# Patient Record
Sex: Female | Born: 1990 | Race: Black or African American | Hispanic: No | Marital: Single | Smoking: Former smoker
Health system: Southern US, Community
[De-identification: ages and names within clinical notes are randomized; demographics above are authoritative.]

## PROBLEM LIST (undated history)

## (undated) DIAGNOSIS — Z789 Other specified health status: Secondary | ICD-10-CM

## (undated) HISTORY — PX: NO PAST SURGERIES: SHX2092

---

## 2011-07-10 LAB — OB RESULTS CONSOLE GBS: GBS: NEGATIVE

## 2011-08-20 LAB — OB RESULTS CONSOLE HEPATITIS B SURFACE ANTIGEN: Hepatitis B Surface Ag: NEGATIVE

## 2011-12-27 ENCOUNTER — Encounter (HOSPITAL_COMMUNITY): Payer: Self-pay

## 2011-12-27 ENCOUNTER — Emergency Department (HOSPITAL_COMMUNITY)
Admission: EM | Admit: 2011-12-27 | Discharge: 2011-12-27 | Disposition: A | Discharge status: Home / Self Care | Payer: Medicaid Other | Attending: Emergency Medicine | Admitting: Emergency Medicine

## 2011-12-27 DIAGNOSIS — Z87891 Personal history of nicotine dependence: Secondary | ICD-10-CM | POA: Insufficient documentation

## 2011-12-27 DIAGNOSIS — O210 Mild hyperemesis gravidarum: Secondary | ICD-10-CM | POA: Insufficient documentation

## 2011-12-27 LAB — POCT PREGNANCY, URINE: Preg Test, Ur: POSITIVE — AB

## 2011-12-27 LAB — URINALYSIS, ROUTINE W REFLEX MICROSCOPIC
Glucose, UA: NEGATIVE mg/dL
Hgb urine dipstick: NEGATIVE
Specific Gravity, Urine: 1.02 (ref 1.005–1.030)

## 2011-12-27 MED ORDER — ONDANSETRON 4 MG PO TBDP: 4.0000 mg | ORAL_TABLET | Freq: Three times a day (TID) | ORAL | Status: AC | PRN

## 2011-12-27 NOTE — ED Notes (Signed)
Pt presents with 3 week h/o nausea, pt reports last period was 7/5.  Pt reports intermittent suprapubic pain and reports "it feels like my period".  Pt denies any vaginal discharge or dysuria.

## 2011-12-27 NOTE — ED Notes (Signed)
Prescription given with discharge instructions.  

## 2011-12-27 NOTE — ED Provider Notes (Signed)
History     CSN: [id number]  Arrival date & time 12/27/11  1322   First MD Initiated Contact with Patient 12/27/11 1816      Chief Complaint  Patient presents with  . Morning Sickness    ) HPI Pt presents with 3 week h/o nausea, pt reports last period was 7/5. Pt reports intermittent suprapubic pain and reports "it feels like my period". Pt denies any vaginal discharge or dysuria.  History reviewed. No pertinent past medical history.  History reviewed. No pertinent past surgical history.  No family history on file.  History  Substance Use Topics  . Smoking status: Former Games developer  . Smokeless tobacco: Not on file  . Alcohol Use: No    OB History    Grav Para Term Preterm Abortions TAB SAB Ect Mult Living                  Review of Systems  All other systems reviewed and are negative.    Allergies  Review of patient's allergies indicates no known allergies.  Home Medications   Current Outpatient Rx  Name Route Sig Dispense Refill  . ONDANSETRON 4 MG PO TBDP Oral Take 1 tablet (4 mg total) by mouth every 8 (eight) hours as needed for nausea. 20 tablet 0    BP 132/65  Pulse 70  Temp 98.8 F (37.1 C) (Oral)  Resp 18  Ht 5\' 4"  (1.626 m)  Wt 230 lb (104.327 kg)  BMI 39.48 kg/m2  SpO2 100%  LMP 11/01/2011  Physical Exam  Nursing note and vitals reviewed. Constitutional: She is oriented to person, place, and time. She appears well-developed. No distress.  HENT:  Head: Normocephalic and atraumatic.  Mouth/Throat: Oropharynx is clear and moist and mucous membranes are normal. Mucous membranes are not dry.  Eyes: Pupils are equal, round, and reactive to light.  Neck: Normal range of motion.  Cardiovascular: Normal rate and intact distal pulses.   Pulmonary/Chest: No respiratory distress.  Abdominal: Normal appearance. She exhibits no distension.  Musculoskeletal: Normal range of motion.  Neurological: She is alert and oriented to person, place, and time.  No cranial nerve deficit.  Skin: Skin is warm and dry. No rash noted.  Psychiatric: She has a normal mood and affect. Her behavior is normal.    ED Course  Procedures (including critical care time)  Labs Reviewed  POCT PREGNANCY, URINE - Abnormal; Notable for the following:    Preg Test, Ur POSITIVE (*)     All other components within normal limits  URINALYSIS, ROUTINE W REFLEX MICROSCOPIC   No results found.   1. Morning sickness       MDM          Nelia Shi, MD 12/27/11 [phone]

## 2011-12-27 NOTE — ED Notes (Signed)
Pt c/o the wait.  Explanation for people being called before herself

## 2012-01-20 LAB — OB RESULTS CONSOLE RPR: RPR: NONREACTIVE

## 2012-01-20 LAB — OB RESULTS CONSOLE ABO/RH: RH Type: POSITIVE

## 2012-04-29 NOTE — L&D Delivery Note (Signed)
Delivery Note At 4:22 PM a viable female was delivered via Vaginal, Spontaneous Delivery (Presentation: Left Occiput Anterior).  APGAR: , ; weight .   Placenta status: Intact, Spontaneous.  Cord:  with the following complications: None.  Cord pH: not done  Anesthesia: Epidural  Episiotomy: None Lacerations: None Suture Repair: 2.0 Est. Blood Loss (mL): 250  Mom to postpartum.  Baby to nursery-stable.  [name] A 08/16/2012, 4:35 PM

## 2012-07-09 LAB — OB RESULTS CONSOLE GBS: GBS: NEGATIVE

## 2012-08-16 ENCOUNTER — Inpatient Hospital Stay (HOSPITAL_COMMUNITY)
Admission: AD | Admit: 2012-08-16 | Discharge: 2012-08-18 | DRG: 775 | Disposition: A | Discharge status: Home / Self Care | Payer: Medicaid Other | Source: Ambulatory Visit | Attending: Obstetrics | Admitting: Obstetrics

## 2012-08-16 ENCOUNTER — Encounter (HOSPITAL_COMMUNITY): Payer: Self-pay

## 2012-08-16 ENCOUNTER — Inpatient Hospital Stay (HOSPITAL_COMMUNITY): Payer: Medicaid Other | Admitting: Anesthesiology

## 2012-08-16 ENCOUNTER — Encounter (HOSPITAL_COMMUNITY): Payer: Self-pay | Admitting: Anesthesiology

## 2012-08-16 HISTORY — DX: Other specified health status: Z78.9

## 2012-08-16 LAB — CBC
HCT: 34.9 % — ABNORMAL LOW (ref 36.0–46.0)
Hemoglobin: 12.3 g/dL (ref 12.0–15.0)
RBC: 4.18 MIL/uL (ref 3.87–5.11)
WBC: 10.5 10*3/uL (ref 4.0–10.5)

## 2012-08-16 LAB — TYPE AND SCREEN: ABO/RH(D): A POS

## 2012-08-16 LAB — ABO/RH: ABO/RH(D): A POS

## 2012-08-16 MED ORDER — OXYCODONE-ACETAMINOPHEN 5-325 MG PO TABS: 1.0000 | ORAL_TABLET | ORAL | Status: DC | PRN

## 2012-08-16 MED ORDER — FLEET ENEMA 7-19 GM/118ML RE ENEM: 1.0000 | ENEMA | RECTAL | Status: DC | PRN

## 2012-08-16 MED ORDER — TETANUS-DIPHTH-ACELL PERTUSSIS 5-2.5-18.5 LF-MCG/0.5 IM SUSP
0.5000 mL | Freq: Once | INTRAMUSCULAR | Status: AC
  Administered 2012-08-17: 0.5 mL via INTRAMUSCULAR

## 2012-08-16 MED ORDER — ONDANSETRON HCL 4 MG/2ML IJ SOLN: 4.0000 mg | INTRAMUSCULAR | Status: DC | PRN

## 2012-08-16 MED ORDER — LACTATED RINGERS IV SOLN
500.0000 mL | Freq: Once | INTRAVENOUS | Status: AC
  Administered 2012-08-16: 500 mL via INTRAVENOUS

## 2012-08-16 MED ORDER — ACETAMINOPHEN 325 MG PO TABS: 650.0000 mg | ORAL_TABLET | ORAL | Status: DC | PRN

## 2012-08-16 MED ORDER — WITCH HAZEL-GLYCERIN EX PADS: 1.0000 "application " | MEDICATED_PAD | CUTANEOUS | Status: DC | PRN

## 2012-08-16 MED ORDER — FENTANYL 2.5 MCG/ML BUPIVACAINE 1/10 % EPIDURAL INFUSION (WH - ANES)
14.0000 mL/h | INTRAMUSCULAR | Status: DC | PRN
  Filled 2012-08-16: qty 125

## 2012-08-16 MED ORDER — PRENATAL MULTIVITAMIN CH
1.0000 | ORAL_TABLET | Freq: Every day | ORAL | Status: DC
  Administered 2012-08-17: 1 via ORAL
  Filled 2012-08-16: qty 1

## 2012-08-16 MED ORDER — IBUPROFEN 600 MG PO TABS
600.0000 mg | ORAL_TABLET | Freq: Four times a day (QID) | ORAL | Status: DC
  Administered 2012-08-16 – 2012-08-18 (×7): 600 mg via ORAL
  Filled 2012-08-16 (×3): qty 1

## 2012-08-16 MED ORDER — LIDOCAINE HCL (PF) 1 % IJ SOLN
30.0000 mL | INTRAMUSCULAR | Status: DC | PRN
  Filled 2012-08-16 (×2): qty 30

## 2012-08-16 MED ORDER — BUTORPHANOL TARTRATE 1 MG/ML IJ SOLN
INTRAMUSCULAR | Status: AC
  Administered 2012-08-16: 1 mg via INTRAVENOUS
  Filled 2012-08-16: qty 1

## 2012-08-16 MED ORDER — LACTATED RINGERS IV SOLN: 500.0000 mL | INTRAVENOUS | Status: DC | PRN

## 2012-08-16 MED ORDER — DIPHENHYDRAMINE HCL 50 MG/ML IJ SOLN: 12.5000 mg | INTRAMUSCULAR | Status: DC | PRN

## 2012-08-16 MED ORDER — BENZOCAINE-MENTHOL 20-0.5 % EX AERO: 1.0000 "application " | INHALATION_SPRAY | CUTANEOUS | Status: DC | PRN

## 2012-08-16 MED ORDER — LACTATED RINGERS IV SOLN
INTRAVENOUS | Status: DC
  Administered 2012-08-16: 07:00:00 via INTRAVENOUS

## 2012-08-16 MED ORDER — ONDANSETRON HCL 4 MG/2ML IJ SOLN: 4.0000 mg | Freq: Four times a day (QID) | INTRAMUSCULAR | Status: DC | PRN

## 2012-08-16 MED ORDER — FERROUS SULFATE 325 (65 FE) MG PO TABS
325.0000 mg | ORAL_TABLET | Freq: Two times a day (BID) | ORAL | Status: DC
  Administered 2012-08-17 – 2012-08-18 (×3): 325 mg via ORAL
  Filled 2012-08-16 (×4): qty 1

## 2012-08-16 MED ORDER — EPHEDRINE 5 MG/ML INJ
10.0000 mg | INTRAVENOUS | Status: DC | PRN
  Filled 2012-08-16: qty 4
  Filled 2012-08-16: qty 2

## 2012-08-16 MED ORDER — DIBUCAINE 1 % RE OINT: 1.0000 "application " | TOPICAL_OINTMENT | RECTAL | Status: DC | PRN

## 2012-08-16 MED ORDER — DIPHENHYDRAMINE HCL 25 MG PO CAPS: 25.0000 mg | ORAL_CAPSULE | Freq: Four times a day (QID) | ORAL | Status: DC | PRN

## 2012-08-16 MED ORDER — BUTORPHANOL TARTRATE 1 MG/ML IJ SOLN
1.0000 mg | INTRAMUSCULAR | Status: DC | PRN
  Administered 2012-08-16: 1 mg via INTRAVENOUS

## 2012-08-16 MED ORDER — CITRIC ACID-SODIUM CITRATE 334-500 MG/5ML PO SOLN: 30.0000 mL | ORAL | Status: DC | PRN

## 2012-08-16 MED ORDER — OXYTOCIN BOLUS FROM INFUSION: 500.0000 mL | INTRAVENOUS | Status: DC

## 2012-08-16 MED ORDER — ZOLPIDEM TARTRATE 5 MG PO TABS: 5.0000 mg | ORAL_TABLET | Freq: Every evening | ORAL | Status: DC | PRN

## 2012-08-16 MED ORDER — LIDOCAINE HCL (PF) 1 % IJ SOLN
INTRAMUSCULAR | Status: DC | PRN
  Administered 2012-08-16 (×2): 5 mL
  Administered 2012-08-16: 3 mL

## 2012-08-16 MED ORDER — IBUPROFEN 600 MG PO TABS
600.0000 mg | ORAL_TABLET | Freq: Four times a day (QID) | ORAL | Status: DC | PRN
  Filled 2012-08-16 (×4): qty 1

## 2012-08-16 MED ORDER — EPHEDRINE 5 MG/ML INJ
10.0000 mg | INTRAVENOUS | Status: DC | PRN
  Filled 2012-08-16: qty 2

## 2012-08-16 MED ORDER — PHENYLEPHRINE 40 MCG/ML (10ML) SYRINGE FOR IV PUSH (FOR BLOOD PRESSURE SUPPORT)
80.0000 ug | PREFILLED_SYRINGE | INTRAVENOUS | Status: DC | PRN
  Filled 2012-08-16: qty 2
  Filled 2012-08-16: qty 5

## 2012-08-16 MED ORDER — FENTANYL 2.5 MCG/ML BUPIVACAINE 1/10 % EPIDURAL INFUSION (WH - ANES)
INTRAMUSCULAR | Status: DC | PRN
  Administered 2012-08-16: 14 mL/h via EPIDURAL

## 2012-08-16 MED ORDER — PHENYLEPHRINE 40 MCG/ML (10ML) SYRINGE FOR IV PUSH (FOR BLOOD PRESSURE SUPPORT)
80.0000 ug | PREFILLED_SYRINGE | INTRAVENOUS | Status: DC | PRN
  Administered 2012-08-16: 09:00:00 via INTRAVENOUS
  Filled 2012-08-16: qty 2

## 2012-08-16 MED ORDER — ONDANSETRON HCL 4 MG PO TABS: 4.0000 mg | ORAL_TABLET | ORAL | Status: DC | PRN

## 2012-08-16 MED ORDER — LANOLIN HYDROUS EX OINT: TOPICAL_OINTMENT | CUTANEOUS | Status: DC | PRN

## 2012-08-16 MED ORDER — SIMETHICONE 80 MG PO CHEW: 80.0000 mg | CHEWABLE_TABLET | ORAL | Status: DC | PRN

## 2012-08-16 MED ORDER — SENNOSIDES-DOCUSATE SODIUM 8.6-50 MG PO TABS: 2.0000 | ORAL_TABLET | Freq: Every day | ORAL | Status: DC

## 2012-08-16 MED ORDER — OXYTOCIN 40 UNITS IN LACTATED RINGERS INFUSION - SIMPLE MED
62.5000 mL/h | INTRAVENOUS | Status: DC
  Administered 2012-08-16: 62.5 mL/h via INTRAVENOUS
  Filled 2012-08-16: qty 1000

## 2012-08-16 NOTE — Anesthesia Procedure Notes (Signed)
Epidural Patient location during procedure: OB  Staffing Anesthesiologist: Phillips Grout Performed by: anesthesiologist   Preanesthetic Checklist Completed: patient identified, site marked, surgical consent, pre-op evaluation, timeout performed, IV checked, risks and benefits discussed and monitors and equipment checked  Epidural Patient position: sitting Prep: ChloraPrep Patient monitoring: heart rate, continuous pulse ox and blood pressure Approach: right paramedian Injection technique: LOR saline  Needle:  Needle type: Tuohy  Needle gauge: 17 G Needle length: 9 cm and 9 Needle insertion depth: 10 cm Catheter type: closed end flexible Catheter size: 20 Guage Catheter at skin depth: 15 cm Test dose: negative  Assessment Events: blood not aspirated, injection not painful, no injection resistance, negative IV test and no paresthesia  Additional Notes   Patient tolerated the insertion well without complications.

## 2012-08-16 NOTE — Anesthesia Preprocedure Evaluation (Signed)
Anesthesia Evaluation  Patient identified by MRN, date of birth, ID band Patient awake    Reviewed: Allergy & Precautions, H&P , NPO status , Patient's Chart, lab work & pertinent test results, reviewed documented beta blocker date and time   History of Anesthesia Complications Negative for: history of anesthetic complications  Airway Mallampati: II TM Distance: >3 FB Neck ROM: full    Dental no notable dental hx. (+) Teeth Intact   Pulmonary neg pulmonary ROS,  breath sounds clear to auscultation  Pulmonary exam normal       Cardiovascular Exercise Tolerance: Good negative cardio ROS  Rhythm:regular Rate:Normal     Neuro/Psych negative neurological ROS  negative psych ROS   GI/Hepatic negative GI ROS, Neg liver ROS,   Endo/Other  negative endocrine ROSMorbid obesity  Renal/GU negative Renal ROS  negative genitourinary   Musculoskeletal   Abdominal Normal abdominal exam  (+)   Peds  Hematology negative hematology ROS (+)   Anesthesia Other Findings   Reproductive/Obstetrics negative OB ROS (+) Pregnancy                           Anesthesia Physical Anesthesia Plan  ASA: II  Anesthesia Plan: General   Post-op Pain Management:    Induction:   Airway Management Planned:   Additional Equipment:   Intra-op Plan:   Post-operative Plan:   Informed Consent: I have reviewed the patients History and Physical, chart, labs and discussed the procedure including the risks, benefits and alternatives for the proposed anesthesia with the patient or authorized representative who has indicated his/her understanding and acceptance.   Dental Advisory Given  Plan Discussed with: CRNA  Anesthesia Plan Comments:         Anesthesia Quick Evaluation

## 2012-08-16 NOTE — H&P (Signed)
This is Dr. Francoise Ceo dictating the history and physical on Caitlin Hill she is a 22 year old primigravida EDC 417 1440 weeks and 3 days negative GBS admitted in labor cervix 6 cm 100% vertex -1 amniotomy performed the fluids meconium-stained she is an epidural Past medical history negative Past surgical history negative Social history negative System review negative Physical exam well-developed female in labor HEENT negative Heart regular rhythm no murmurs no gallops Breasts negative Lung lungs clear to P&A Abdomen term Pelvic as described above Extremities negative

## 2012-08-16 NOTE — MAU Note (Signed)
Pt came in via EMS stating contractions began at 4:50am and they are "coming back to back". Denies vaginal bleeding but does not know if water broke.

## 2012-08-17 LAB — CBC
MCH: 28.7 pg (ref 26.0–34.0)
MCHC: 33.7 g/dL (ref 30.0–36.0)
Platelets: 249 10*3/uL (ref 150–400)
RBC: 3.48 MIL/uL — ABNORMAL LOW (ref 3.87–5.11)

## 2012-08-17 NOTE — Progress Notes (Signed)
Patient ID: Caitlin Hill, female   DOB: [birth year], 22 y.o.   MRN: [id number] Postpartum day one Vital signs normal Fundus firm Lochia moderate Legs negative doing well

## 2012-08-17 NOTE — Progress Notes (Signed)
UR chart review completed.  

## 2012-08-17 NOTE — Anesthesia Postprocedure Evaluation (Signed)
  Anesthesia Post-op Note  Patient: Caitlin Hill  Procedure(s) Performed: * No procedures listed *  Patient Location: Mother/Baby  Anesthesia Type:Epidural  Level of Consciousness: awake, alert  and oriented  Airway and Oxygen Therapy: Patient Spontanous Breathing  Post-op Pain: mild  Post-op Assessment: Patient's Cardiovascular Status Stable and Respiratory Function Stable  Post-op Vital Signs: stable  Complications: No apparent anesthesia complications

## 2012-08-18 NOTE — Discharge Summary (Signed)
Obstetric Discharge Summary Reason for Admission: onset of labor Prenatal Procedures: none Intrapartum Procedures: spontaneous vaginal delivery Postpartum Procedures: none Complications-Operative and Postpartum: none Hemoglobin  Date Value Range Status  08/17/2012 10.0* 12.0 - 15.0 g/dL Final     DELTA CHECK NOTED     REPEATED TO VERIFY     HCT  Date Value Range Status  08/17/2012 29.7* 36.0 - 46.0 % Final    Physical Exam:  General: alert Lochia: appropriate Uterine Fundus: firm Incision: healing well DVT Evaluation: No evidence of DVT seen on physical exam.  Discharge Diagnoses: Term Pregnancy-delivered  Discharge Information: Date: 08/18/2012 Activity: pelvic rest Diet: routine Medications: Percocet Condition: stable Instructions: refer to practice specific booklet Discharge to: home Follow-up Information   Follow up with Caitlin Hill A, MD. Schedule an appointment as soon as possible for a visit in 6 weeks.   Contact information:   [address] [place] [address] [phone]       Newborn Data: Live born female  Birth Weight: 5 lb 15.9 oz (2719 g) APGAR: 7, 8  Home with mother.  Caitlin Hill A 08/18/2012, 6:38 AM

## 2014-02-28 ENCOUNTER — Encounter (HOSPITAL_COMMUNITY): Payer: Self-pay

## 2017-05-29 ENCOUNTER — Other Ambulatory Visit: Payer: Self-pay

## 2017-05-29 ENCOUNTER — Emergency Department (HOSPITAL_COMMUNITY)
Admission: EM | Admit: 2017-05-29 | Discharge: 2017-05-29 | Disposition: A | Discharge status: Home / Self Care | Payer: Medicaid Other | Attending: Emergency Medicine | Admitting: Emergency Medicine

## 2017-05-29 ENCOUNTER — Encounter (HOSPITAL_COMMUNITY): Payer: Self-pay

## 2017-05-29 DIAGNOSIS — Z87891 Personal history of nicotine dependence: Secondary | ICD-10-CM | POA: Diagnosis not present

## 2017-05-29 DIAGNOSIS — H7291 Unspecified perforation of tympanic membrane, right ear: Secondary | ICD-10-CM | POA: Diagnosis not present

## 2017-05-29 DIAGNOSIS — H9201 Otalgia, right ear: Secondary | ICD-10-CM | POA: Diagnosis present

## 2017-05-29 LAB — I-STAT BETA HCG BLOOD, ED (MC, WL, AP ONLY): I-stat hCG, quantitative: <5 m[IU]/mL (ref <5)

## 2017-05-29 MED ORDER — OFLOXACIN 0.3 % OP SOLN
5.0000 [drp] | Freq: Two times a day (BID) | OPHTHALMIC | Status: DC
  Administered 2017-05-29: 5 [drp] via OTIC
  Filled 2017-05-29: qty 5

## 2017-05-29 MED ORDER — NAPROXEN 375 MG PO TABS: 375.0000 mg | ORAL_TABLET | Freq: Two times a day (BID) | ORAL | 0 refills | Status: DC

## 2017-05-29 MED ORDER — IBUPROFEN 400 MG PO TABS
600.0000 mg | ORAL_TABLET | Freq: Once | ORAL | Status: AC
  Administered 2017-05-29: 600 mg via ORAL
  Filled 2017-05-29: qty 1

## 2017-05-29 NOTE — ED Triage Notes (Signed)
Pt reports standing and having a pop in her ear last Friday and has had pain in her right ear since.

## 2017-05-29 NOTE — ED Notes (Signed)
Declined W/C at D/C and was escorted to lobby by RN. 

## 2017-05-29 NOTE — ED Triage Notes (Signed)
Pt now reports ear feels swollen

## 2017-05-29 NOTE — Discharge Instructions (Signed)
Instill 5 drops of the oflaxacin into the right ear 2 times daily for 5 days. Please follow-up with your primary care provider within the next two weeks.

## 2017-05-29 NOTE — ED Provider Notes (Signed)
[facility] Provider Note   CSN: [id number] Arrival date & time: 05/29/17  1508     History   Chief Complaint Chief Complaint  Patient presents with  . Otalgia    HPI Terence LuxLashaunda Klemmer is a 27 y.o. female.  Patient states she stood up at work on Friday and felt a pop in her ear. She reports pain to the ear and blood tinged drainage.   The history is provided by the patient. No language interpreter was used.  Otalgia  The current episode started more than 2 days ago. There is pain in the right ear. The problem occurs constantly. The problem has not changed since onset.There has been no fever. The pain is moderate. Associated symptoms include ear discharge.    Past Medical History:  Diagnosis Date  . Medical history non-contributory     There are no active problems to display for this patient.   Past Surgical History:  Procedure Laterality Date  . NO PAST SURGERIES      OB History    Gravida Para Term Preterm AB Living   1 1 1     1    SAB TAB Ectopic Multiple Live Births           1       Home Medications    Prior to Admission medications   Not on File    Family History No family history on file.  Social History Social History   Tobacco Use  . Smoking status: Former Smoker  Substance Use Topics  . Alcohol use: No  . Drug use: No     Allergies   Patient has no known allergies.   Review of Systems Review of Systems  HENT: Positive for ear discharge and ear pain.   All other systems reviewed and are negative.    Physical Exam Updated Vital Signs BP 137/81   Pulse 82   Temp 98.4 F (36.9 C)   Resp 18   Wt 120.2 kg (265 lb)   SpO2 100%   BMI 46.94 kg/m   Physical Exam  Constitutional: She is oriented to person, place, and time. She appears well-developed and well-nourished.  HENT:  Right Ear: Tympanic membrane is perforated.  Mouth/Throat: Oropharynx is clear and moist.  Eyes: Conjunctivae  are normal.  Neck: Neck supple.  Cardiovascular: Normal rate and regular rhythm.  Pulmonary/Chest: Effort normal and breath sounds normal.  Abdominal: Soft. Bowel sounds are normal.  Musculoskeletal: Normal range of motion.  Lymphadenopathy:    She has no cervical adenopathy.  Neurological: She is alert and oriented to person, place, and time.  Skin: Skin is warm and dry.  Psychiatric: She has a normal mood and affect.  Nursing note and vitals reviewed.    ED Treatments / Results  Labs (all labs ordered are listed, but only abnormal results are displayed) Labs Reviewed - No data to display  EKG  EKG Interpretation None       Radiology No results found.  Procedures Procedures (including critical care time)  Medications Ordered in ED Medications  ofloxacin (OCUFLOX) 0.3 % ophthalmic solution 5 drop (5 drops Right EAR Given 05/29/17 1829)  ibuprofen (ADVIL,MOTRIN) tablet 600 mg (600 mg Oral Given 05/29/17 1811)     Initial Impression / Assessment and Plan / ED Course  I have reviewed the triage vital signs and the nursing notes.  Pertinent labs & imaging results that were available during my care of the patient were reviewed  by me and considered in my medical decision making (see chart for details).     Patient with non-traumatic, spontaneous tympanic membrane rupture of the right ear. No vestibular symptoms. No facial nerve paralysis. No foreign body. Will treat with ofloxacin. Follow-up with PCP. ENT referral information also provided. Care instructions and return precautions discussed. Patient appears safe for discharge at this time.  Final Clinical Impressions(s) / ED Diagnoses   Final diagnoses:  Tympanic membrane perforation, right    ED Discharge Orders        Ordered    naproxen (NAPROSYN) 375 MG tablet  2 times daily     05/29/17 1835       Felicie Morn, NP 05/29/17 1845    Tegeler, Canary Brim, MD 05/30/17 [phone]

## 2017-09-01 ENCOUNTER — Encounter (HOSPITAL_COMMUNITY): Payer: Self-pay | Admitting: *Deleted

## 2017-09-01 ENCOUNTER — Other Ambulatory Visit: Payer: Self-pay

## 2017-09-01 ENCOUNTER — Emergency Department (HOSPITAL_COMMUNITY)
Admission: EM | Admit: 2017-09-01 | Discharge: 2017-09-01 | Disposition: A | Discharge status: Home / Self Care | Payer: Medicaid Other | Attending: Emergency Medicine | Admitting: Emergency Medicine

## 2017-09-01 DIAGNOSIS — B9689 Other specified bacterial agents as the cause of diseases classified elsewhere: Secondary | ICD-10-CM | POA: Diagnosis not present

## 2017-09-01 DIAGNOSIS — Z79899 Other long term (current) drug therapy: Secondary | ICD-10-CM | POA: Diagnosis not present

## 2017-09-01 DIAGNOSIS — N760 Acute vaginitis: Secondary | ICD-10-CM | POA: Diagnosis not present

## 2017-09-01 DIAGNOSIS — Z87891 Personal history of nicotine dependence: Secondary | ICD-10-CM | POA: Insufficient documentation

## 2017-09-01 DIAGNOSIS — N939 Abnormal uterine and vaginal bleeding, unspecified: Secondary | ICD-10-CM | POA: Insufficient documentation

## 2017-09-01 LAB — CBC WITH DIFFERENTIAL/PLATELET
Basophils Absolute: 0 10*3/uL (ref 0.0–0.1)
Basophils Relative: 0 %
Eosinophils Absolute: 0.3 10*3/uL (ref 0.0–0.7)
Eosinophils Relative: 4 %
HCT: 35.1 % — ABNORMAL LOW (ref 36.0–46.0)
HEMOGLOBIN: 10.9 g/dL — AB (ref 12.0–15.0)
LYMPHS ABS: 2.2 10*3/uL (ref 0.7–4.0)
LYMPHS PCT: 33 %
MCH: 23.3 pg — AB (ref 26.0–34.0)
MCHC: 31.1 g/dL (ref 30.0–36.0)
MCV: 75.2 fL — AB (ref 78.0–100.0)
Monocytes Absolute: 0.4 10*3/uL (ref 0.1–1.0)
Monocytes Relative: 6 %
NEUTROS ABS: 3.9 10*3/uL (ref 1.7–7.7)
NEUTROS PCT: 57 %
PLATELETS: 397 10*3/uL (ref 150–400)
RBC: 4.67 MIL/uL (ref 3.87–5.11)
RDW: 16.6 % — AB (ref 11.5–15.5)
WBC: 6.8 10*3/uL (ref 4.0–10.5)

## 2017-09-01 LAB — WET PREP, GENITAL
Sperm: NONE SEEN
Trich, Wet Prep: NONE SEEN
Yeast Wet Prep HPF POC: NONE SEEN

## 2017-09-01 LAB — I-STAT BETA HCG BLOOD, ED (MC, WL, AP ONLY): I-stat hCG, quantitative: <5 m[IU]/mL (ref <5)

## 2017-09-01 LAB — I-STAT CG4 LACTIC ACID, ED: Lactic Acid, Venous: 1.66 mmol/L (ref 0.5–1.9)

## 2017-09-01 MED ORDER — METRONIDAZOLE 500 MG PO TABS: 500.0000 mg | ORAL_TABLET | Freq: Two times a day (BID) | ORAL | 0 refills | Status: DC

## 2017-09-01 MED ORDER — MEDROXYPROGESTERONE ACETATE 10 MG PO TABS: 10.0000 mg | ORAL_TABLET | Freq: Three times a day (TID) | ORAL | 0 refills | Status: DC

## 2017-09-01 NOTE — ED Provider Notes (Addendum)
[facility] EMERGENCY DEPARTMENT Provider Note   CSN: [id number] Arrival date & time: 09/01/17  1228   History   Chief Complaint Chief Complaint  Patient presents with  . Vaginal Bleeding    HPI Caitlin Hill is a 27 y.o. female.  HPI   27 year old female presents today with complaints of vaginal bleeding.  Patient notes that she has irregular menstrual cycles, most notably in February she started having off and on bleeding.  She notes yesterday was heavier, today spotting.  Patient notes in the past she has taken birth control which helped stop her menstrual cycle.  Patient denies any associated abdominal pain, dizziness, history of bleeding disorders.  Patient denies vaginal discharge.  Patient is a non-smoker with no history of DVTs.    Past Medical History:  Diagnosis Date  . Medical history non-contributory     There are no active problems to display for this patient.   Past Surgical History:  Procedure Laterality Date  . NO PAST SURGERIES       OB History    Gravida  1   Para  1   Term  1   Preterm      AB      Living  1     SAB      TAB      Ectopic      Multiple      Live Births  1            Home Medications    Prior to Admission medications   Medication Sig Start Date End Date Taking? Authorizing Provider  medroxyPROGESTERone (PROVERA) 10 MG tablet Take 1 tablet (10 mg total) by mouth 3 (three) times daily. 09/01/17   Lux Meaders, Tinnie Gens, PA-C  metroNIDAZOLE (FLAGYL) 500 MG tablet Take 1 tablet (500 mg total) by mouth 2 (two) times daily. 09/01/17   [place] Takacs, Tinnie Gens, PA-C  naproxen (NAPROSYN) 375 MG tablet Take 1 tablet (375 mg total) by mouth 2 (two) times daily. 05/29/17   Felicie Morn, NP    Family History No family history on file.  Social History Social History   Tobacco Use  . Smoking status: Former Smoker    Last attempt to quit: 03/29/2017    Years since quitting: 0.4  . Smokeless tobacco: Never Used    Substance Use Topics  . Alcohol use: No  . Drug use: No     Allergies   Patient has no known allergies.   Review of Systems Review of Systems  All other systems reviewed and are negative.    Physical Exam Updated Vital Signs BP (!) 141/79 (BP Location: Right Arm)   Pulse 88   Temp 98.3 F (36.8 C) (Oral)   Resp 16   Ht  (1.626 m)   Wt 120.2 kg (265 lb)   LMP 09/01/2017   SpO2 98%   Breastfeeding? No   BMI 45.49 kg/m   Physical Exam  Constitutional: She is oriented to person, place, and time. She appears well-developed and well-nourished.  HENT:  Head: Normocephalic and atraumatic.  Eyes: Pupils are equal, round, and reactive to light. Conjunctivae are normal. Right eye exhibits no discharge. Left eye exhibits no discharge. No scleral icterus.  Neck: Normal range of motion. No JVD present. No tracheal deviation present.  Pulmonary/Chest: Effort normal. No stridor.  Abdominal: Soft. She exhibits no distension. There is no tenderness.  Genitourinary:  Genitourinary Comments: Blood noted in the vaginal vault, no significant discharge, no friability  lacerations-no cervical motion tenderness  Neurological: She is alert and oriented to person, place, and time. Coordination normal.  Psychiatric: She has a normal mood and affect. Her behavior is normal. Judgment and thought content normal.  Nursing note and vitals reviewed.    ED Treatments / Results  Labs (all labs ordered are listed, but only abnormal results are displayed) Labs Reviewed  WET PREP, GENITAL - Abnormal; Notable for the following components:      Result Value   Clue Cells Wet Prep HPF POC PRESENT (*)    WBC, Wet Prep HPF POC MODERATE (*)    All other components within normal limits  CBC WITH DIFFERENTIAL/PLATELET - Abnormal; Notable for the following components:   Hemoglobin 10.9 (*)    HCT 35.1 (*)    MCV 75.2 (*)    MCH 23.3 (*)    RDW 16.6 (*)    All other components within normal limits   I-STAT BETA HCG BLOOD, ED ([facility], [facility], [facility] ONLY)  I-STAT BETA HCG BLOOD, ED ([facility], [facility], [facility] ONLY)  I-STAT CG4 LACTIC ACID, ED  GC/CHLAMYDIA PROBE AMP ([place]) NOT AT [facility]    EKG None  Radiology No results found.  Procedures Procedures (including critical care time)  Medications Ordered in ED Medications - No data to display   Initial Impression / Assessment and Plan / ED Course  I have reviewed the triage vital signs and the nursing notes.  Pertinent labs & imaging results that were available during my care of the patient were reviewed by me and considered in my medical decision making (see chart for details).      Labs: Wet prep, i-STAT beta-hCG, i-STAT lactic acid, CBC  Imaging:  Consults:  Therapeutics:  Discharge Meds:   Assessment/Plan:   27 year old female presents today with vaginal bleeding.  Patient's hemoglobin appears stable from her visit in February, no signs of vital sign instability.  Patient requesting birth control, I find this reasonable given her duration of symptoms.  She will be placed on 7-day course of progesterone and encouraged to follow-up closely with OB/GYN.  Patient was counseled on risks and benefits of taking medication she would like to proceed.  She is given strict return precautions, she verbalized understanding and agreement to today's plan had no further questions or concerns at the time discharge  Final Clinical Impressions(s) / ED Diagnoses   Final diagnoses:  Vaginal bleeding  Bacterial vaginosis    ED Discharge Orders        Ordered    medroxyPROGESTERone (PROVERA) 10 MG tablet  3 times daily     09/01/17 1838    metroNIDAZOLE (FLAGYL) 500 MG tablet  2 times daily     09/01/17 1857       Eyvonne Mechanic, PA-C 09/01/17 1926    Eyvonne Mechanic, PA-C 09/01/17 1926    Derwood Kaplan, MD 09/03/17 1846

## 2017-09-01 NOTE — ED Provider Notes (Signed)
Patient placed in Quick Look pathway, seen and evaluated   Chief Complaint: vaginal bleeding HPI:   Pt with bleeding since february. No pain. States bleeding at times heavy with clots, at times just spotting. Hx of the same, just never this severe. Does not have obgyn. States in the past birth control helped, requesting the same. Not sure if pregnant.   ROS: positive for vaginal bleeding. Negative for abd pain, dizziness, lightheadiness  Physical Exam:   Gen: No distress  Neuro: Awake and Alert  Skin: Warm    Focused Exam: abdomen soft,  Non tender. No pallor.    Initiation of care has begun. The patient has been counseled on the process, plan, and necessity for staying for the completion/evaluation, and the remainder of the medical screening examination   Pt with vaginal bleeding for last 3 months. Hx of the same. Will check hgb and preg test.    Jaynie Crumble, PA-C 09/01/17 1327    Mancel Bale, MD 09/02/17 [phone]

## 2017-09-01 NOTE — ED Notes (Signed)
Patient brought back to room and given urine spec cup.

## 2017-09-01 NOTE — ED Notes (Signed)
IM paged to [facility] regarding a unassigned admission

## 2017-09-01 NOTE — ED Triage Notes (Signed)
Pt in c/o constant vaginal bleeding since middle of  Feb 2019, denies pain, pt reports bleeding with wiping and changing pads at times every 2 hours, pt reports hx of taking birth control for vaginal bleeding in 2016 while living in [place], denies weakness, A&Ox 4

## 2017-09-01 NOTE — ED Notes (Signed)
IM repaged

## 2017-09-01 NOTE — Discharge Instructions (Signed)
Please read attached information. If you experience any new or worsening signs or symptoms please return to the emergency room for evaluation. Please follow-up with your primary care provider or specialist as discussed. Please use medication prescribed only as directed and discontinue taking if you have any concerning signs or symptoms.   °

## 2017-09-01 NOTE — ED Notes (Signed)
Patient called x 2 with no response. 

## 2017-09-02 LAB — GC/CHLAMYDIA PROBE AMP (~~LOC~~) NOT AT ARMC
Chlamydia: NEGATIVE
Neisseria Gonorrhea: NEGATIVE

## 2017-09-30 ENCOUNTER — Encounter (HOSPITAL_COMMUNITY): Payer: Self-pay | Admitting: *Deleted

## 2017-09-30 ENCOUNTER — Inpatient Hospital Stay (HOSPITAL_COMMUNITY)
Admission: AD | Admit: 2017-09-30 | Discharge: 2017-09-30 | Disposition: A | Discharge status: Home / Self Care | Payer: Medicaid Other | Source: Ambulatory Visit | Attending: Obstetrics & Gynecology | Admitting: Obstetrics & Gynecology

## 2017-09-30 ENCOUNTER — Other Ambulatory Visit: Payer: Self-pay

## 2017-09-30 DIAGNOSIS — Z79899 Other long term (current) drug therapy: Secondary | ICD-10-CM | POA: Diagnosis not present

## 2017-09-30 DIAGNOSIS — N939 Abnormal uterine and vaginal bleeding, unspecified: Secondary | ICD-10-CM | POA: Diagnosis not present

## 2017-09-30 DIAGNOSIS — Z792 Long term (current) use of antibiotics: Secondary | ICD-10-CM | POA: Insufficient documentation

## 2017-09-30 DIAGNOSIS — Z793 Long term (current) use of hormonal contraceptives: Secondary | ICD-10-CM | POA: Diagnosis not present

## 2017-09-30 DIAGNOSIS — R109 Unspecified abdominal pain: Secondary | ICD-10-CM | POA: Diagnosis present

## 2017-09-30 DIAGNOSIS — D500 Iron deficiency anemia secondary to blood loss (chronic): Secondary | ICD-10-CM

## 2017-09-30 LAB — URINALYSIS, ROUTINE W REFLEX MICROSCOPIC
BACTERIA UA: NONE SEEN
BILIRUBIN URINE: NEGATIVE
Glucose, UA: NEGATIVE mg/dL
Ketones, ur: NEGATIVE mg/dL
LEUKOCYTES UA: NEGATIVE
NITRITE: NEGATIVE
Protein, ur: 30 mg/dL — AB
SPECIFIC GRAVITY, URINE: 1.015 (ref 1.005–1.030)
pH: 5 (ref 5.0–8.0)

## 2017-09-30 LAB — CBC
HEMATOCRIT: 29.8 % — AB (ref 36.0–46.0)
Hemoglobin: 9.6 g/dL — ABNORMAL LOW (ref 12.0–15.0)
MCH: 23.8 pg — ABNORMAL LOW (ref 26.0–34.0)
MCHC: 32.2 g/dL (ref 30.0–36.0)
MCV: 73.8 fL — AB (ref 78.0–100.0)
Platelets: 299 10*3/uL (ref 150–400)
RBC: 4.04 MIL/uL (ref 3.87–5.11)
RDW: 16.3 % — AB (ref 11.5–15.5)
WBC: 4.9 10*3/uL (ref 4.0–10.5)

## 2017-09-30 LAB — POCT PREGNANCY, URINE: Preg Test, Ur: NEGATIVE

## 2017-09-30 MED ORDER — MEGESTROL ACETATE 40 MG PO TABS: 40.0000 mg | ORAL_TABLET | Freq: Two times a day (BID) | ORAL | 0 refills | Status: DC

## 2017-09-30 NOTE — MAU Note (Signed)
So period has been on for like 2 wks, really heavy flow. Having cramps the last 2 days.   Usually period only lasts 3 days and doesn't have cramping.  [facility]

## 2017-09-30 NOTE — Discharge Instructions (Signed)
Abnormal Uterine Bleeding Abnormal uterine bleeding means bleeding more than usual from your uterus. It can include:  Bleeding between periods.  Bleeding after sex.  Bleeding that is heavier than normal.  Periods that last longer than usual.  Bleeding after you have stopped having your period (menopause).  There are many problems that may cause this. You should see a doctor for any kind of bleeding that is not normal. Treatment depends on the cause of the bleeding. Follow these instructions at home:  Watch your condition for any changes.  Do not use tampons, douche, or have sex, if your doctor tells you not to.  Change your pads often.  Get regular well-woman exams. Make sure they include a pelvic exam and cervical cancer screening.  Keep all follow-up visits as told by your doctor. This is important. Contact a doctor if:  The bleeding lasts more than one week.  You feel dizzy at times.  You feel like you are going to throw up (nauseous).  You throw up. Get help right away if:  You pass out.  You have to change pads every hour.  You have belly (abdominal) pain.  You have a fever.  You get sweaty.  You get weak.  You passing large blood clots from your vagina. Summary  Abnormal uterine bleeding means bleeding more than usual from your uterus.  There are many problems that may cause this. You should see a doctor for any kind of bleeding that is not normal.  Treatment depends on the cause of the bleeding. This information is not intended to replace advice given to you by your health care provider. Make sure you discuss any questions you have with your health care provider. Document Released: 02/10/2009 Document Revised: 04/09/2016 Document Reviewed: 04/09/2016 Elsevier Interactive Patient Education  2017 [employer]   Anemia Anemia is a condition in which you do not have enough red blood cells or hemoglobin. Hemoglobin is a substance in red blood cells  that carries oxygen. When you do not have enough red blood cells or hemoglobin (are anemic), your body cannot get enough oxygen and your organs may not work properly. As a result, you may feel very tired or have other problems. What are the causes? Common causes of anemia include:  Excessive bleeding. Anemia can be caused by excessive bleeding inside or outside the body, including bleeding from the intestine or from periods in women.  Poor nutrition.  Long-lasting (chronic) kidney, thyroid, and liver disease.  Bone marrow disorders.  Cancer and treatments for cancer.  HIV (human immunodeficiency virus) and AIDS (acquired immunodeficiency syndrome).  Treatments for HIV and AIDS.  Spleen problems.  Blood disorders.  Infections, medicines, and autoimmune disorders that destroy red blood cells.  What are the signs or symptoms? Symptoms of this condition include:  Minor weakness.  Dizziness.  Headache.  Feeling heartbeats that are irregular or faster than normal (palpitations).  Shortness of breath, especially with exercise.  Paleness.  Cold sensitivity.  Indigestion.  Nausea.  Difficulty sleeping.  Difficulty concentrating.  Symptoms may occur suddenly or develop slowly. If your anemia is mild, you may not have symptoms. How is this diagnosed? This condition is diagnosed based on:  Blood tests.  Your medical history.  A physical exam.  Bone marrow biopsy.  Your health care provider may also check your stool (feces) for blood and may do additional testing to look for the cause of your bleeding. You may also have other tests, including:  Imaging tests, such  as a CT scan or MRI.  Endoscopy.  Colonoscopy.  How is this treated? Treatment for this condition depends on the cause. If you continue to lose a lot of blood, you may need to be treated at a hospital. Treatment may include:  Taking supplements of iron, vitamin Q72, or folic acid.  Taking a  hormone medicine (erythropoietin) that can help to stimulate red blood cell growth.  Having a blood transfusion. This may be needed if you lose a lot of blood.  Making changes to your diet.  Having surgery to remove your spleen.  Follow these instructions at home:  Take over-the-counter and prescription medicines only as told by your health care provider.  Take supplements only as told by your health care provider.  Follow any diet instructions that you were given.  Keep all follow-up visits as told by your health care provider. This is important. Contact a health care provider if:  You develop new bleeding anywhere in the body. Get help right away if:  You are very weak.  You are short of breath.  You have pain in your abdomen or chest.  You are dizzy or feel faint.  You have trouble concentrating.  You have bloody or black, tarry stools.  You vomit repeatedly or you vomit up blood. Summary  Anemia is a condition in which you do not have enough red blood cells or enough of a substance in your red blood cells that carries oxygen (hemoglobin).  Symptoms may occur suddenly or develop slowly.  If your anemia is mild, you may not have symptoms.  This condition is diagnosed with blood tests as well as a medical history and physical exam. Other tests may be needed.  Treatment for this condition depends on the cause of the anemia. This information is not intended to replace advice given to you by your health care provider. Make sure you discuss any questions you have with your health care provider. Document Released: 05/23/2004 Document Revised: 05/17/2016 Document Reviewed: 05/17/2016 Elsevier Interactive Patient Education  [employer].

## 2017-09-30 NOTE — [facility] Provider Note (Addendum)
History     CSN: [id number]  Arrival date and time: 09/30/17 1324   First Provider Initiated Contact with Patient 09/30/17 1433      Chief Complaint  Patient presents with  . Abdominal Pain  . Vaginal Bleeding   27 y.o. Non-pregnant female here with VB. Bleeding started almost 2 weeks ago. Heavy on some days, saturates a pad over 1 hr or less, passes golf ball sized clots and smaller ones. Associated sx are uterine cramping. Has not taken anything for the pain. Was last sexually active 1 month ago. No new partner. Does not use contraception. Was seen in [facility] last month for AUB lasting 5 mos. Given Provera which stopped bleeding but then it returned. [place] from [place] and does not have GYN provider here. Recent STD screen negative.   Past Medical History:  Diagnosis Date  . Medical history non-contributory     Past Surgical History:  Procedure Laterality Date  . NO PAST SURGERIES      History reviewed. No pertinent family history.  Social History   Tobacco Use  . Smoking status: Former Smoker    Last attempt to quit: 03/29/2017    Years since quitting: 0.5  . Smokeless tobacco: Never Used  Substance Use Topics  . Alcohol use: No  . Drug use: No    Allergies: No Known Allergies  Medications Prior to Admission  Medication Sig Dispense Refill Last Dose  . medroxyPROGESTERone (PROVERA) 10 MG tablet Take 1 tablet (10 mg total) by mouth 3 (three) times daily. 21 tablet 0   . metroNIDAZOLE (FLAGYL) 500 MG tablet Take 1 tablet (500 mg total) by mouth 2 (two) times daily. 14 tablet 0   . naproxen (NAPROSYN) 375 MG tablet Take 1 tablet (375 mg total) by mouth 2 (two) times daily. 20 tablet 0     Review of Systems  Constitutional: Negative.   Gastrointestinal: Positive for abdominal pain.  Genitourinary: Positive for vaginal bleeding.   Physical Exam   Blood pressure (!) 148/74, pulse 89, temperature 98.4 F (36.9 C), temperature source Oral, resp. rate 20, weight  (!) 338 lb 12 oz (153.7 kg), last menstrual period 09/19/2017, SpO2 99 %.  Physical Exam  Constitutional: She is oriented to person, place, and time. She appears well-developed and well-nourished. No distress.  HENT:  Head: Normocephalic and atraumatic.  Neck: Normal range of motion.  Cardiovascular: Normal rate.  Respiratory: Effort normal. No respiratory distress.  Genitourinary:  Genitourinary Comments: External: no lesions or erythema Vagina: rugated, pink, moist, small bloody discharge, cleared with 1 fox swab Uterus/adnexae: unable to assess d/t body habitus No CMT    Musculoskeletal: Normal range of motion.  Neurological: She is alert and oriented to person, place, and time.  Skin: Skin is warm and dry.  Psychiatric: She has a normal mood and affect.   Results for orders placed or performed during the hospital encounter of 09/30/17 (from the past 24 hour(s))  Urinalysis, Routine w reflex microscopic     Status: Abnormal   Collection Time: 09/30/17  1:55 PM  Result Value Ref Range   Color, Urine AMBER (A) YELLOW   APPearance HAZY (A) CLEAR   Specific Gravity, Urine 1.015 1.005 - 1.030   pH 5.0 5.0 - 8.0   Glucose, UA NEGATIVE NEGATIVE mg/dL   Hgb urine dipstick LARGE (A) NEGATIVE   Bilirubin Urine NEGATIVE NEGATIVE   Ketones, ur NEGATIVE NEGATIVE mg/dL   Protein, ur 30 (A) NEGATIVE mg/dL   Nitrite NEGATIVE  NEGATIVE   Leukocytes, UA NEGATIVE NEGATIVE   RBC / HPF >50 (H) 0 - 5 RBC/hpf   WBC, UA 21-50 0 - 5 WBC/hpf   Bacteria, UA NONE SEEN NONE SEEN   Squamous Epithelial / LPF 0-5 0 - 5   Mucus PRESENT   Pregnancy, urine POC     Status: None   Collection Time: 09/30/17  2:16 PM  Result Value Ref Range   Preg Test, Ur NEGATIVE NEGATIVE  CBC     Status: Abnormal   Collection Time: 09/30/17  2:55 PM  Result Value Ref Range   WBC 4.9 4.0 - 10.5 K/uL   RBC 4.04 3.87 - 5.11 MIL/uL   Hemoglobin 9.6 (L) 12.0 - 15.0 g/dL   HCT 40.9 (L) 81.1 - 91.4 %   MCV 73.8 (L) 78.0 -  100.0 fL   MCH 23.8 (L) 26.0 - 34.0 pg   MCHC 32.2 30.0 - 36.0 g/dL   RDW 78.2 (H) 95.6 - 21.3 %   Platelets 299 150 - 400 K/uL   MAU Course  Procedures  MDM Labs ordered and reviewed. Mild anemia noted, pt recently started MTV with Fe, instructed to continue, increase dietary sources. No evidence of acute pelvic process. Discussed mngt with Megace and further outp work-up including TVUS, pt agrees. Pt aware Megace is not contraception and should use barrier method if doesn't desire pregnancy. Stable for discharge home.  Assessment and Plan   1. Abnormal uterine bleeding (AUB)   2. Blood loss anemia    Discharge home Follow up in [facility] after US Pelvic US in 1-2 weeks Rx Megace  Allergies as of 09/30/2017   No Known Allergies     Medication List    STOP taking these medications   medroxyPROGESTERone 10 MG tablet Commonly known as:  PROVERA     TAKE these medications   megestrol 40 MG tablet Commonly known as:  MEGACE Take 1 tablet (40 mg total) by mouth 2 (two) times daily.   metroNIDAZOLE 500 MG tablet Commonly known as:  FLAGYL Take 1 tablet (500 mg total) by mouth 2 (two) times daily.   naproxen 375 MG tablet Commonly known as:  NAPROSYN Take 1 tablet (375 mg total) by mouth 2 (two) times daily.      Donette Larry, CNM 09/30/2017, 4:18 PM

## 2017-10-07 ENCOUNTER — Telehealth: Payer: Self-pay | Admitting: General Practice

## 2017-10-07 NOTE — Telephone Encounter (Signed)
Patient notified of appointment with Dr. Marice Potterove on 10/22/17 at 3:15pm for f/u from US on 10/15/17.  Patient verbalized understanding.

## 2017-10-15 ENCOUNTER — Ambulatory Visit (HOSPITAL_COMMUNITY): Payer: Medicaid Other

## 2017-10-21 ENCOUNTER — Other Ambulatory Visit (HOSPITAL_COMMUNITY): Payer: Medicaid Other

## 2017-10-21 ENCOUNTER — Ambulatory Visit (HOSPITAL_COMMUNITY)
Admission: RE | Admit: 2017-10-21 | Discharge: 2017-10-21 | Disposition: A | Discharge status: Home / Self Care | Payer: Medicaid Other | Source: Ambulatory Visit | Attending: Certified Nurse Midwife | Admitting: Certified Nurse Midwife

## 2017-10-21 DIAGNOSIS — N939 Abnormal uterine and vaginal bleeding, unspecified: Secondary | ICD-10-CM | POA: Insufficient documentation

## 2017-10-22 ENCOUNTER — Encounter: Payer: Self-pay | Admitting: Obstetrics & Gynecology

## 2017-10-22 ENCOUNTER — Ambulatory Visit (INDEPENDENT_AMBULATORY_CARE_PROVIDER_SITE_OTHER): Payer: Medicaid Other | Admitting: Obstetrics & Gynecology

## 2017-10-22 VITALS — BP 133/74 | HR 84 | Resp 16 | Wt 331.6 lb

## 2017-10-22 DIAGNOSIS — N938 Other specified abnormal uterine and vaginal bleeding: Secondary | ICD-10-CM | POA: Diagnosis not present

## 2017-10-22 DIAGNOSIS — Z0000 Encounter for general adult medical examination without abnormal findings: Secondary | ICD-10-CM

## 2017-10-22 MED ORDER — MEGESTROL ACETATE 40 MG PO TABS: 40.0000 mg | ORAL_TABLET | Freq: Two times a day (BID) | ORAL | 3 refills | Status: DC

## 2017-10-22 NOTE — Progress Notes (Signed)
   Subjective:    Patient ID: Caitlin Hill, female    DOB: 01/27/1991, 27 y.o.   MRN: [id number]  HPI 27 yo married 72P1 (29 yo daughter) here today for results of her u/s done yesterday. [facility] for DUB. She was given megace and her bleeding is very light now.  She is wanting a pregnancy. She takes MVI daily. She has been bleeding almost daily for 3 months. She has been craving ice, her hbg is 9.6    Review of Systems     Objective:   Physical Exam Breathing, conversing, and ambulating normally Morbidly obese, well hydrated Black female, no apparent distress      Assessment & Plan:  DUB- will schedule EMBX and pap smear Anemia- rec iron QD/BID Morbid obesity- check hga1c

## 2017-10-23 ENCOUNTER — Telehealth: Payer: Self-pay | Admitting: General Practice

## 2017-10-23 LAB — TSH: TSH: 1.04 u[IU]/mL (ref 0.450–4.500)

## 2017-10-23 LAB — HEMOGLOBIN A1C
Est. average glucose Bld gHb Est-mCnc: 117 mg/dL
Hgb A1c MFr Bld: 5.7 % — ABNORMAL HIGH (ref 4.8–5.6)

## 2017-10-23 NOTE — Telephone Encounter (Signed)
-----   Message from Allie BossierMyra C Dove, MD sent at 10/23/2017  8:16 AM EDT ----- Her HBA1C is elevated. She will need to see her primary care provider about this. Thanks

## 2017-10-23 NOTE — Telephone Encounter (Signed)
Called patient, no answer- left message stating we are trying to reach you with results, please call us back. Will send letter 

## 2017-10-24 NOTE — Telephone Encounter (Signed)
Patient called today and left message she got a message .  Per notes we have made attempt to reach her by phone and have sent letter.

## 2017-10-29 ENCOUNTER — Telehealth: Payer: Self-pay | Admitting: General Practice

## 2017-10-29 NOTE — Telephone Encounter (Signed)
Patient called and left message on nurse voicemail line stating she received a call last week for results & called back but no one has returned her call yet. Per chart review, a letter was sent to the patient.  Called patient, no answer- left message stating we are trying to reach you to return your phone call. Please call us back, you can also let us know if we can leave a detailed message or not.

## 2017-11-11 ENCOUNTER — Telehealth: Payer: Self-pay | Admitting: Obstetrics & Gynecology

## 2017-11-11 NOTE — Telephone Encounter (Signed)
Caitlin Hill called on [facility]'s back line to make an appointment. She was upset because no one had called her back, and she had called three times. [facility] transferred the call to me. Caitlin Hill explain why she was upset, and stated she had called three times and no one had returned her call. I informed her in fact two nurses tried to call her back, and left a voicemail that they had called. She stated that was not true. I asked her to hold on so I could get a nurse to talk to her.  Caitlin Sonarrie RN spoke with her, then asked me to get her scheduled with Dr. Marice Hill for an Endometrial biopsy, and with a pap smear. Patient stated she need to come in either Monday, or Tuesday of next week. I informed her Dr. Ellin Hill's next appointment would not be until August. She stated to schedule her with another provider. She has not been intimate with her husband, and wanted to be seen next week. She has to come on one of those days, in the afternoon because she would need to take her child to the babysitters. I informed her that all the providers were pretty booked up until August. She stated that was not her problem, and demanded to be seen. I scheduled her with Dr Caitlin Hill. However, after speaking with Caitlin Hill, she informed me Caitlin Hill really needed to see Dr. Marice Hill, as Dr. Debroah Hill will not know what's going on with her, and Dr. Marice Hill knows her. I saw that Dr. Marice Hill had an appointment on 07/19. I called Caitlin Hill, and got her voicemail. I left a message stating we had to cancel that appointment, and Dr. Marice Hill had an opening on Friday 07/19 and we could give her a letter for work.

## 2017-11-14 ENCOUNTER — Ambulatory Visit: Payer: Medicaid Other | Admitting: Obstetrics & Gynecology

## 2017-11-17 ENCOUNTER — Telehealth: Payer: Self-pay | Admitting: Obstetrics & Gynecology

## 2017-11-17 ENCOUNTER — Ambulatory Visit: Payer: Medicaid Other | Admitting: Obstetrics & Gynecology

## 2017-11-17 NOTE — Telephone Encounter (Signed)
Patient came to the office today she originally had an appt scheduled for today with Dr.Arnold @3 :15, this apt was canceled and rsch with Dr.Dove on Friday 7-19 @8 :15, patient state she was not aware of the appt change but on our end there was a call made and a message left about the appt change. Patient left the office upset and did not want to make another appt.                                                           Caitlin NoeVanessa [place]

## 2018-06-26 ENCOUNTER — Ambulatory Visit (INDEPENDENT_AMBULATORY_CARE_PROVIDER_SITE_OTHER): Payer: Medicaid Other

## 2018-06-26 ENCOUNTER — Other Ambulatory Visit (HOSPITAL_COMMUNITY)
Admission: RE | Admit: 2018-06-26 | Discharge: 2018-06-26 | Disposition: A | Discharge status: Home / Self Care | Payer: Medicaid Other | Source: Ambulatory Visit | Attending: Obstetrics and Gynecology | Admitting: Obstetrics and Gynecology

## 2018-06-26 ENCOUNTER — Ambulatory Visit: Payer: Medicaid Other | Admitting: Obstetrics & Gynecology

## 2018-06-26 DIAGNOSIS — N898 Other specified noninflammatory disorders of vagina: Secondary | ICD-10-CM

## 2018-06-26 LAB — POCT URINALYSIS DIP (DEVICE)
Bilirubin Urine: NEGATIVE
GLUCOSE, UA: NEGATIVE mg/dL
KETONES UR: NEGATIVE mg/dL
LEUKOCYTE UA: NEGATIVE
Nitrite: NEGATIVE
Protein, ur: NEGATIVE mg/dL
SPECIFIC GRAVITY, URINE: 1.015 (ref 1.005–1.030)
Urobilinogen, UA: 0.2 mg/dL (ref 0.0–1.0)
pH: 5 (ref 5.0–8.0)

## 2018-06-26 NOTE — Progress Notes (Signed)
Pt here today complaining of vaginal itching.  Pt explained how to obtain self swab and that we will call her with abnormal results.  Pt stated understanding with no further questions.

## 2018-06-29 NOTE — Progress Notes (Signed)
I have reviewed the chart and agree with nursing staff's documentation of this patient's encounter.  Sharyon Cable, CNM 06/29/2018 11:41 AM

## 2018-06-30 ENCOUNTER — Telehealth: Payer: Self-pay | Admitting: General Practice

## 2018-06-30 LAB — CERVICOVAGINAL ANCILLARY ONLY
Bacterial vaginitis: NEGATIVE
Candida vaginitis: NEGATIVE
Chlamydia: NEGATIVE
Neisseria Gonorrhea: NEGATIVE
Trichomonas: NEGATIVE

## 2018-06-30 NOTE — Telephone Encounter (Signed)
LM on VM that her results are normal and if she has any questions to please give [place] a call back.

## 2018-06-30 NOTE — Telephone Encounter (Signed)
Patient called and left message on nurse voicemail line stating she is calling for her results. Patient states she was told results would be back by tomorrow. Per chart review, results are still not back- called Cytology who states there was an error with the machine and it has been down. Patient's test is being ran today and should be back by tomorrow.  Called patient, no answer- left message to call us back regarding results.

## 2019-09-16 ENCOUNTER — Telehealth: Payer: Self-pay | Admitting: *Deleted

## 2019-09-16 ENCOUNTER — Ambulatory Visit: Payer: Medicaid Other

## 2019-09-16 NOTE — Telephone Encounter (Signed)
Call to patient concerning missed appointment.  Unable to reach per phone or to leave a message.  Angelina Ok, RN 09/15/2019 10:54 AM.

## 2020-07-28 ENCOUNTER — Other Ambulatory Visit: Payer: Self-pay

## 2020-07-28 ENCOUNTER — Other Ambulatory Visit (HOSPITAL_COMMUNITY)
Admission: RE | Admit: 2020-07-28 | Discharge: 2020-07-28 | Disposition: A | Discharge status: Home / Self Care | Payer: Medicaid Other | Source: Ambulatory Visit | Attending: Medical | Admitting: Medical

## 2020-07-28 ENCOUNTER — Ambulatory Visit (INDEPENDENT_AMBULATORY_CARE_PROVIDER_SITE_OTHER): Payer: Medicaid Other | Admitting: Medical

## 2020-07-28 ENCOUNTER — Encounter: Payer: Self-pay | Admitting: Medical

## 2020-07-28 VITALS — BP 143/90 | HR 103 | Ht 65.0 in | Wt >= 6400 oz

## 2020-07-28 DIAGNOSIS — Z01411 Encounter for gynecological examination (general) (routine) with abnormal findings: Secondary | ICD-10-CM

## 2020-07-28 DIAGNOSIS — N938 Other specified abnormal uterine and vaginal bleeding: Secondary | ICD-10-CM

## 2020-07-28 MED ORDER — MEGESTROL ACETATE 40 MG PO TABS
40.0000 mg | ORAL_TABLET | Freq: Every day | ORAL | 3 refills | Status: DC
Start: 2020-07-28 — End: 2023-03-19

## 2020-07-28 NOTE — Patient Instructions (Addendum)
AREA FAMILY PRACTICE PHYSICIANS  [place] ([id number]) . [facility] o [address] [place], [place] [address] o [phone] o Mon-Fri 8:30-12:30, 1:30-5:00 o Accepting Medicaid . [facility] o [address], [place], [place] [address] o [phone] o Mon-Fri 8:00-5:30 . [facility] o [address] [place], [place] [address] o [phone] o Mon, Tue, Thur, Fri 8:30-5:00, Wed 10:00-7:00 (closed 1-2pm) o Accepting Medicaid . [facility] o [address], [place], [place]  [address] o Phone - [phone]   Fax - [phone]  [place] ([address]) . [facility] o [address] [place], [place] [address] o [phone] o Mon-Fri 8:00-5:00 . [facility] ([facility]  o [address] [place], [place] [address] o [phone] o Mon-Fri 8:30-5:30, Sat (Oct.-Mar.) 9:00-1:00 o Accepting Medicaid  [place] ([address]) . [facility] o [address], [place], [place] [address] o [phone] o Mon-Fri 8:00-5:00  [place] ([address]) . [facility] o [address], [place], [place] [address] o [phone] o Mon-Fri 8:00-5:00 . [facility] o [address], [place], [place] [address] o [phone] o Mon-Fri 8:00-5:00 . [facility] o [address] [place], [place] [address] o [phone] o Mon-Fri 8:00-5:00 . [facility] o [address] [place] [address] o [phone] o Mon-Fri 7:30-5:30  [place] ([address] & [address]) . [facility] o [address] [place], [place] [address] o [phone] o Mon-Thur 8:00-6:00 o Accepting Medicaid . [facility] o [address] [place], [place] [address] o [phone] o Mon-Thur 7:30-7:30, Fri 7:30-4:30 o Accepting Medicaid . [facility] o [address], [place], [place]  [address] o [phone]   Fax - [phone]  [place] ([address] & [address]) . [facility] o [address] [place], [place] [address] o [phone] o Mon-Fri 7:00-5:00 . [facility] o [address], [place], [place] [address] o [phone] o Mon-Fri 8:00-5:00 o Accepting Medicaid . [facility] o [address],  [place], [place] [address] o [phone] o Mon-Fri 8:00-5:00 o Accepting Medicaid  [place] ([address]) . [facility] [place] o [address] [place], [place] [address] o [phone] o Mon-Fri 8:00-5:00 . [facility] o [address], [place], [place] [address] o [phone] o Mon-Fri 8:00-5:00 o Accepting Medicaid . [facility] o [address], [place], [place] [address] o [phone] o Mon-Fri 8:00-5:30, Sat&Sun by appointment (phones open at 8:30) o Accepting Medicaid  [place] ([address]) . [facility] o [address] [place], [place] [address] o [phone] o Mon-Thur 8:00-7:00, Fri 8:00-5:00, Sat 8:00-12:00, Sun 9:00-12:00 o Accepting Medicaid . [facility] [place] o [address], [place], [place] [address] o [phone] o Mon-Thur 8:00-5:00 o Accepting Medicaid . [facility] o [address] [place],  [address] o [phone] o Mon-Fri 8:00-5:30, Sat (Oct.-Mar.) 9:00-1:00 o Accepting Medicaid  [place]  ([address]) .   [facility] o [address] [place], [place] [address] o [phone] o Mon-Fri 8:00-5:00 o Accepting Medicaid   [facility] [phone]) . [facility] o [address], [place], [place] [address] o [phone] o Mon-Fri 8:00-5:00 . Nature conservation officer at [facility] o [address], [place], [place] [address] o [phone] o Mon-Fri 8:00-5:00 . [facility] [place] o [address], [place], [place] [address] o [phone] o Mon-Fri 8:00-5:00 o After hours clinic [facility] [place], [place] [address]) [phone] Mon-Fri 5:00-8:00, Sat 12:00-6:00, Sun 10:00-4:00 o Accepting Medicaid . [facility] o [address], [place], [place]  [address] o [phone]   Fax - [phone]  [place] [phone]) . Nature conservation officer at [facility] o [address] [place], [place] [address] o [phone] o Mon-Fri 8:00-5:00 . [facility] [place] [facility] [place]) o [address] [place], [place] [address] o [phone] o Mon-Thur 8:00-7:00, Fri 8:00-5:00, Sat 8:00-12:00    Dysfunctional Uterine Bleeding Dysfunctional uterine bleeding is abnormal bleeding from the uterus. Dysfunctional uterine bleeding includes:  A menstrual period that comes earlier or later than usual.  A menstrual period that is lighter or heavier than usual, or has large blood clots.  Vaginal bleeding between menstrual periods.  Skipping one or more menstrual periods.  Vaginal bleeding after sex.  Vaginal bleeding after menopause. Follow these instructions at home: Eating and drinking  Eat well-balanced meals. Include foods that are high in iron, such as liver, meat, shellfish, green leafy vegetables, and eggs.  To prevent or treat constipation, your health care provider may recommend that you: ? Drink enough fluid to keep your  urine pale yellow. ? Take over-the-counter or prescription medicines. ? Eat foods that are high in fiber, such as beans, whole grains, and fresh fruits and vegetables. ? Limit foods that are high in fat and processed sugars, such as fried or sweet foods.   Medicines  Take over-the-counter and prescription medicines only as told by your health care provider.  Do not change medicines without talking with your health care provider.  Aspirin or medicines that contain aspirin may make the bleeding worse. Do not take those medicines: ? During the week before your menstrual period. ? During your menstrual period.  If you were prescribed iron pills, take them as told by your health care provider. Iron pills help to replace iron that your body loses because of this condition. Activity  If you need to change your sanitary pad or tampon more than one time every 2 hours: ? Lie in bed with your feet raised (elevated). ? Place a cold pack on your lower abdomen. ? Rest as much as possible until the bleeding stops or slows down.  Do not try to lose weight until the bleeding has stopped and your blood iron level is back to normal. General instructions  For two months, write down: ? When your menstrual period starts. ? When your menstrual period ends. ? When any abnormal vaginal bleeding occurs. ? What problems you notice.  Keep all follow up visits as told by your health care provider. This is important.   Contact a health care provider if you:  Feel light-headed or weak.  Have nausea and vomiting.  Cannot eat or drink without vomiting.  Feel dizzy or have diarrhea while you are taking medicines.  Are taking birth control pills or hormones, and you want to change them or stop taking them. Get help right away if:  You develop a fever or chills.  You need to change your sanitary pad or tampon more than one time per hour.  Your vaginal bleeding becomes heavier, or your flow contains  clots more often.  You develop pain in your abdomen.  You lose consciousness.  You develop a rash. Summary  Dysfunctional uterine bleeding is abnormal bleeding from the uterus.  It includes menstrual bleeding of abnormal duration, volume, or regularity.  Bleeding after sex and after menopause are also considered dysfunctional uterine bleeding. This information is not intended to replace advice given to you by your health care provider. Make sure you discuss any questions you have with your health care provider. Document Revised: 09/24/2017 Document Reviewed: 09/24/2017 Elsevier Patient Education  2021 [employer].

## 2020-07-28 NOTE — Progress Notes (Signed)
History:  Caitlin Hill is a 30 y.o. G1P1001 who presents to clinic today for abnormal uterine bleeding. The patient had a similar episode in summer 2019 and was given Megace, which stopped bleeding within a few days. At that time regular periods resumed until December 2021. She has been bleeding most days since December. Some days are heavy and others are very light, only a few days each week at best without bleeding. She is not currently sexually active, she has not been on birth control recently. She denies pain, abnormal discharge, dizziness, weakness or fatigue. She is due for a pap smear. She denies any history of abnormal pap smear. Last [place] in 2019 was normal.   The following portions of the patient's history were reviewed and updated as appropriate: allergies, current medications, family history, past medical history, social history, past surgical history and problem list.  Review of Systems:  Review of Systems  Constitutional: Negative for chills, fever and malaise/fatigue.  Gastrointestinal: Negative for abdominal pain.  Genitourinary:       + vaginal bleeding Neg - vaginal discharge  Neurological: Negative for dizziness.      Objective:  Physical Exam BP (!) 143/90   Pulse (!) 103   Ht 5\' 5"  (1.651 m)   Wt (!) 419 lb 3.2 oz (190.1 kg)   LMP 04/28/2020   BMI 69.76 kg/m  Physical Exam Vitals reviewed. Exam conducted with a chaperone present.  Constitutional:      General: She is not in acute distress.    Appearance: She is well-developed. She is obese.  HENT:     Head: Normocephalic and atraumatic.  Cardiovascular:     Rate and Rhythm: Tachycardia present.  Pulmonary:     Effort: Pulmonary effort is normal.  Abdominal:     General: There is no distension.     Palpations: Abdomen is soft. There is no mass.     Tenderness: There is no abdominal tenderness. There is no guarding or rebound.  Genitourinary:    General: Normal vulva.     Labia:        Right:  No rash, tenderness or lesion.        Left: No rash, tenderness or lesion.      Vagina: Bleeding (scant) present. No vaginal discharge.     Cervix: No cervical motion tenderness, discharge or friability.     Uterus: Not enlarged (exam limited by patient body habitus) and not tender.      Adnexa:        Right: No mass or tenderness.         Left: No mass or tenderness.    Skin:    General: Skin is warm and dry.     Findings: No erythema.  Neurological:     Mental Status: She is alert and oriented to person, place, and time.     Assessment & Plan:  1. Encounter for gynecological examination with abnormal finding - Cytology - PAP( [facility]) - Hepatitis C antibody  2. DUB (dysfunctional uterine bleeding) - HgB A1c - TSH - Testosterone,Free and Total - Prolactin - megestrol (MEGACE) 40 MG tablet; Take 1 tablet (40 mg total) by mouth daily.  Dispense: 14 tablet; Refill: 3  Patient will sign-up for MyChart to manage results. Plan to review labs and determine follow-up based on labs and bleeding control with Megace.  If bleeding is unresponsive, we have discussed the need for 04/30/2020 and Endometrial bx  Approximately 22 minutes of  total time was spent with this patient on history taking, physical exam, coordination of care and documentation.   Marny Lowenstein, PA-C 07/28/2020 9:25 AM

## 2020-07-29 LAB — TSH: TSH: 2.94 u[IU]/mL (ref 0.450–4.500)

## 2020-07-29 LAB — HEMOGLOBIN A1C
Est. average glucose Bld gHb Est-mCnc: 131 mg/dL
Hgb A1c MFr Bld: 6.2 % — ABNORMAL HIGH (ref 4.8–5.6)

## 2020-07-29 LAB — TESTOSTERONE,FREE AND TOTAL
Testosterone, Free: 1.2 pg/mL (ref 0.0–4.2)
Testosterone: 39 ng/dL (ref 8–60)

## 2020-07-29 LAB — HEPATITIS C ANTIBODY: Hep C Virus Ab: <0.1 s/co ratio (ref 0.0–0.9)

## 2020-07-29 LAB — PROLACTIN: Prolactin: 13.3 ng/mL (ref 4.8–23.3)

## 2020-07-31 LAB — CYTOLOGY - PAP
Comment: NEGATIVE
Diagnosis: NEGATIVE
High risk HPV: NEGATIVE

## 2020-11-01 ENCOUNTER — Telehealth: Payer: Self-pay

## 2020-11-01 DIAGNOSIS — N63 Unspecified lump in unspecified breast: Secondary | ICD-10-CM

## 2020-11-01 NOTE — Telephone Encounter (Addendum)
Returned patients call. Patient reports she called the [facility] and was informed that a nurse would call her back. She did get a call back from a [place] number and was transferred to a number to make an appointment. She was put on hold and was not able to hold as was at work.   Patient with lump on around July 1-2 on her left breast, smaller than a pea. The area is movable and some tenderness for about 2 weeks in the left breast only. She reports it felt like when getting ready to start her period. She reports this has not happened before. Child sometimes will elbow mom in the breast.   She denies any discharge from the nipple. She is not breast feeding.   Reviewed with Dr. Debroah Loop. [facility]oint[facility] at the [facility] recommended for breast US.   Called [facility] to schedule. Scheduled for July 25 at 7:30 am arrive at 7:15 am at the [facility].   Called patient back to inform her of appointment date and time. LM for patient with location, date and time of appointment. Asked patient to call the office with any further questions or concerns.

## 2020-11-01 NOTE — Addendum Note (Signed)
Addended by: Ed Blalock on: 11/01/2020 02:23 PM   Modules accepted: Orders

## 2020-11-01 NOTE — Telephone Encounter (Signed)
Pt left VM stating she spoke with nurse yesterday who stated patient should be seen within a few days to discuss lump on breast. Pt was transferred to appt line and was unable to hold due to work schedule.

## 2020-11-17 ENCOUNTER — Other Ambulatory Visit: Payer: Self-pay | Admitting: Obstetrics & Gynecology

## 2020-11-17 DIAGNOSIS — N63 Unspecified lump in unspecified breast: Secondary | ICD-10-CM

## 2020-11-20 ENCOUNTER — Other Ambulatory Visit: Payer: Medicaid Other

## 2020-12-08 ENCOUNTER — Other Ambulatory Visit: Payer: Medicaid Other

## 2021-01-12 ENCOUNTER — Other Ambulatory Visit: Payer: Self-pay

## 2021-01-12 ENCOUNTER — Ambulatory Visit
Admission: RE | Admit: 2021-01-12 | Discharge: 2021-01-12 | Disposition: A | Discharge status: Home / Self Care | Payer: Medicaid Other | Source: Ambulatory Visit | Attending: Obstetrics & Gynecology | Admitting: Obstetrics & Gynecology

## 2021-01-12 ENCOUNTER — Other Ambulatory Visit: Payer: Self-pay | Admitting: Obstetrics & Gynecology

## 2021-01-12 DIAGNOSIS — N63 Unspecified lump in unspecified breast: Secondary | ICD-10-CM

## 2021-05-04 ENCOUNTER — Inpatient Hospital Stay: Admission: RE | Admit: 2021-05-04 | Payer: Medicaid Other | Source: Ambulatory Visit

## 2021-12-23 IMAGING — MG DIGITAL DIAGNOSTIC BILAT W/ TOMO W/ CAD
8 of 15 series · 8 of 40 positions shown · non-contrast
Comparison: None.

ACR Breast Density Category a: The breast tissue is almost entirely
fatty.

CLINICAL DATA: 30-year-old female with palpable thickening and pain
in the UPPER-OUTER LEFT breast, significantly decreased over the
last 2 months. Baseline mammogram.

EXAM:
DIGITAL DIAGNOSTIC BILATERAL MAMMOGRAM WITH TOMOSYNTHESIS AND CAD;
ULTRASOUND LEFT BREAST LIMITED
TECHNIQUE: Bilateral digital diagnostic mammography and breast tomosynthesis
was performed. The images were evaluated with computer-aided
detection.; Targeted ultrasound examination of the left breast was
performed.

[R CC synth-2D]
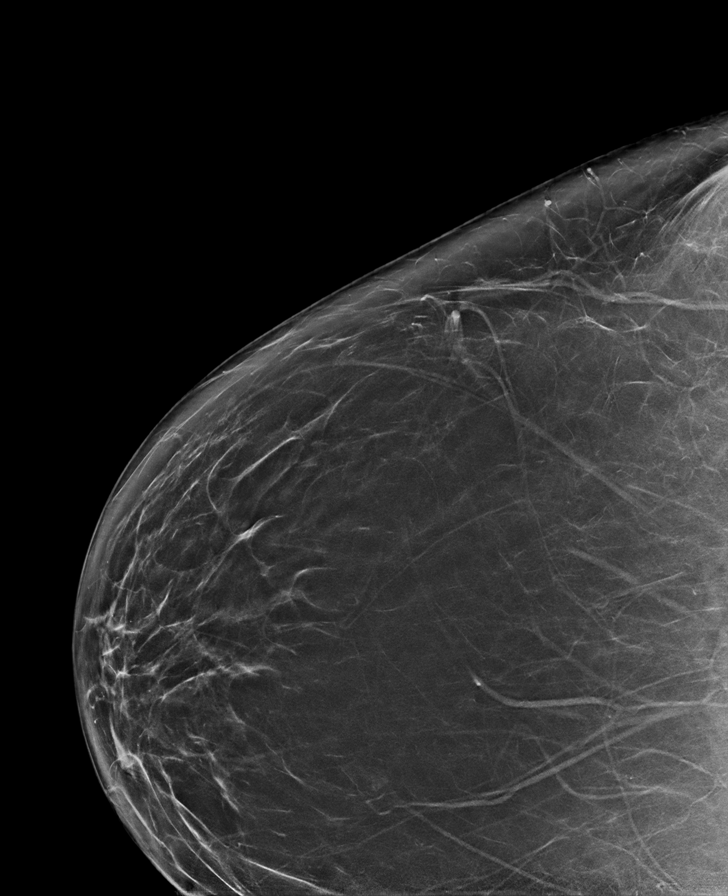

[R CV synth-2D]
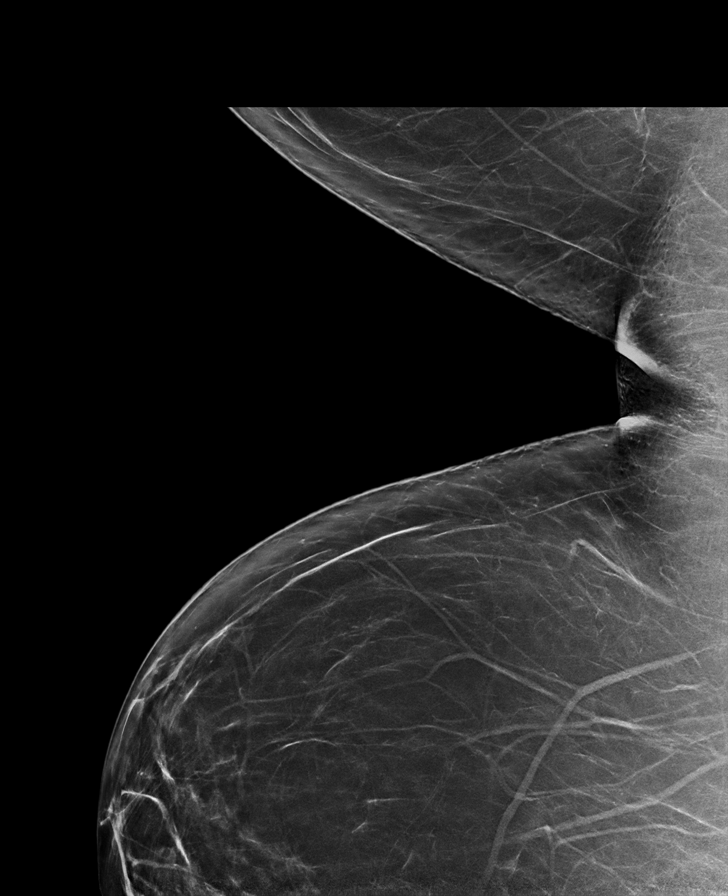

[L MLO synth-2D (1 of 3)]
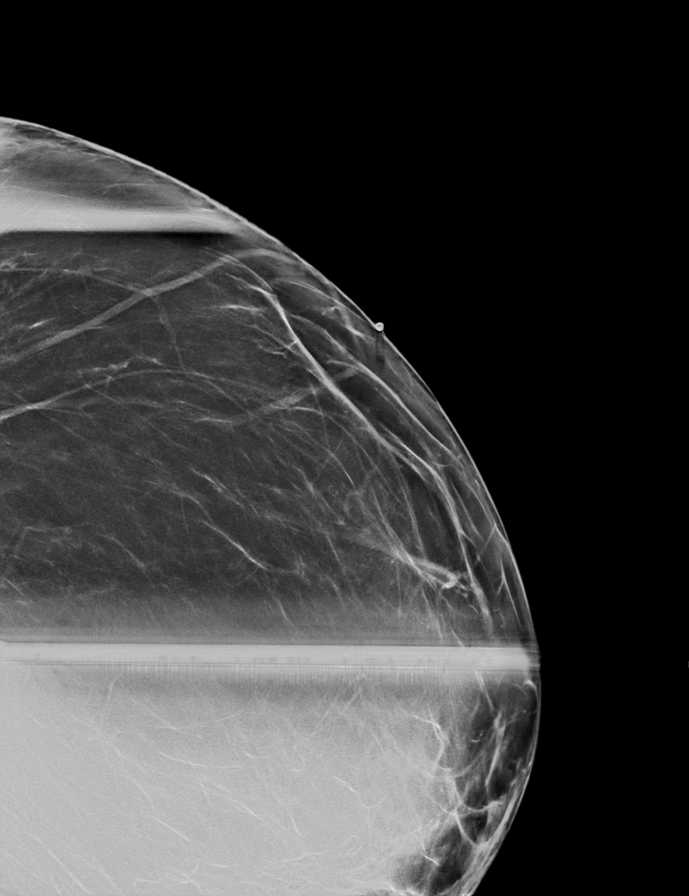

[L CC synth-2D]
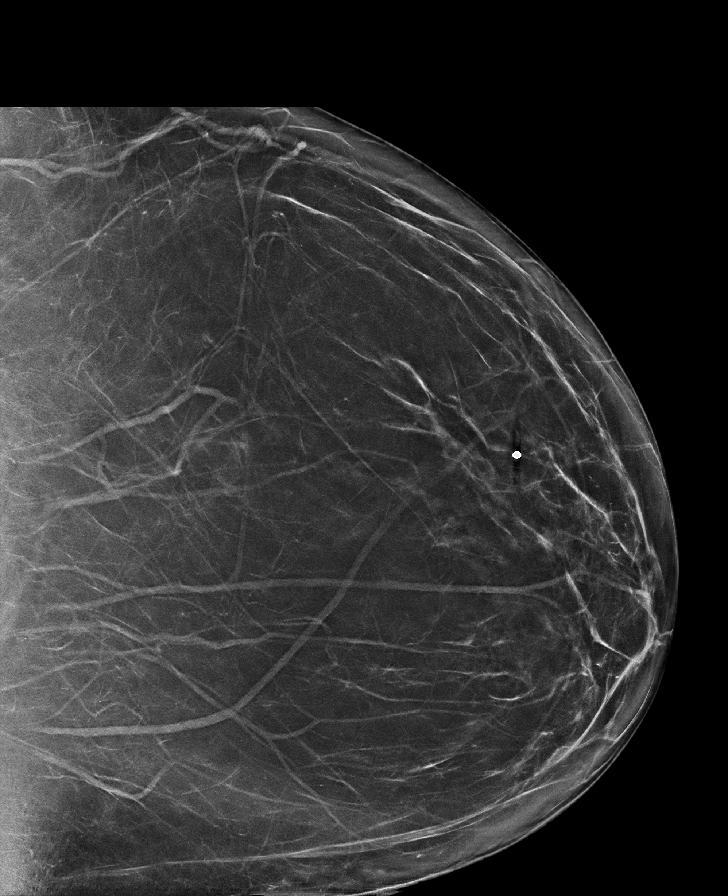

[L MLO synth-2D (2 of 3)]
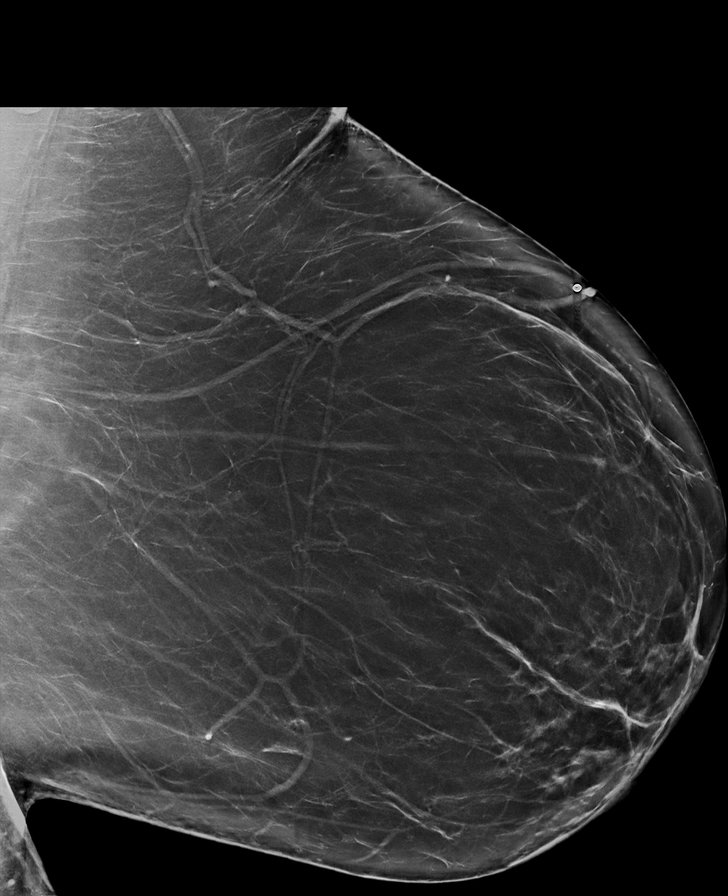

[L MLO synth-2D (3 of 3)]
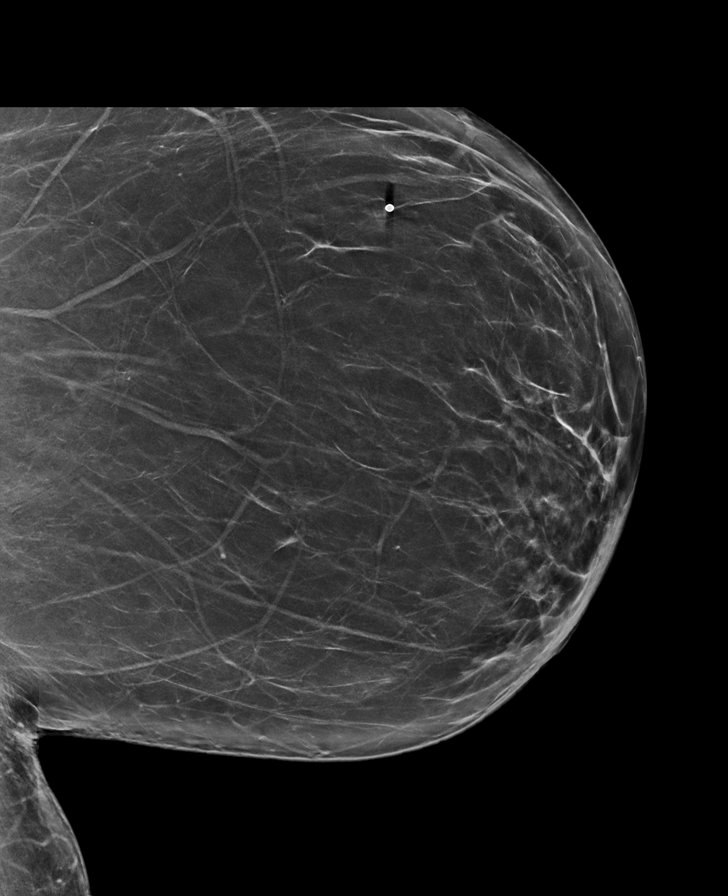

[R MLO synth-2D]
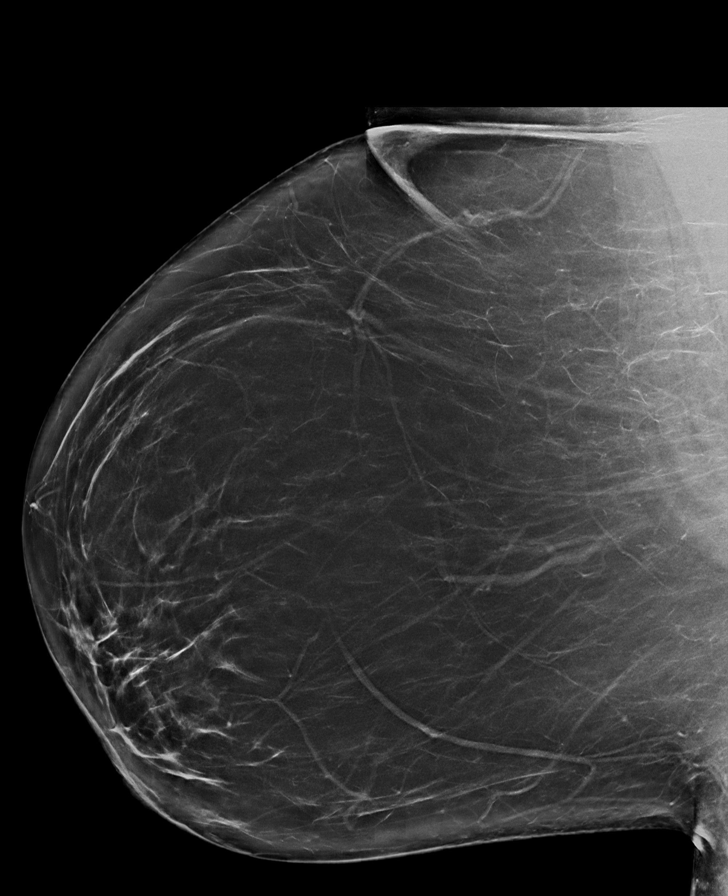

[R CV tomo · tomo slice 49/72.0]
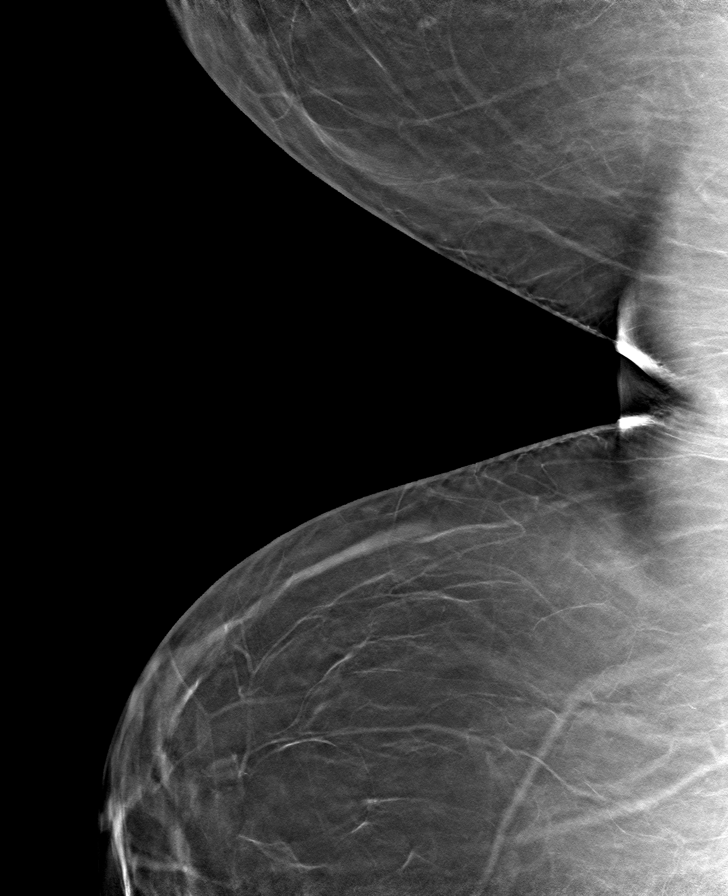

[8 of 40 positions shown; findings below may reference images not displayed]

FINDINGS: 2D/3D full field views of both breasts and spot compression view of
the RIGHT breast demonstrate minimal superficial asymmetry within
the UPPER-OUTER LEFT breast.

No suspicious mass, distortion or worrisome calcifications are noted
within either breast.

Targeted ultrasound is performed, showing a 3 x 0.6 x 3 cm
ill-defined hyperechoic area containing cystic spaces at the 1
o'clock position of the LEFT breast 10 cm from the nipple, likely
representing fat necrosis changes.
IMPRESSION: 1. Probable fat necrosis changes within the UPPER-OUTER LEFT breast.
3 month follow-up recommended to ensure improvement.
2. No suspicious mammographic abnormalities.

RECOMMENDATION:
LEFT breast ultrasound in 3 months.

I have discussed the findings and recommendations with the patient.
If applicable, a reminder letter will be sent to the patient
regarding the next appointment.

BI-RADS CATEGORY  3: Probably benign.

## 2022-11-21 DIAGNOSIS — R062 Wheezing: Secondary | ICD-10-CM | POA: Diagnosis not present

## 2022-11-21 DIAGNOSIS — R350 Frequency of micturition: Secondary | ICD-10-CM | POA: Diagnosis not present

## 2022-11-23 ENCOUNTER — Emergency Department (HOSPITAL_COMMUNITY): Payer: Medicaid Other

## 2022-11-23 ENCOUNTER — Emergency Department (HOSPITAL_COMMUNITY)
Admission: EM | Admit: 2022-11-23 | Discharge: 2022-11-23 | Disposition: A | Discharge status: Home / Self Care | Payer: Medicaid Other | Attending: Emergency Medicine | Admitting: Emergency Medicine

## 2022-11-23 ENCOUNTER — Other Ambulatory Visit: Payer: Self-pay

## 2022-11-23 ENCOUNTER — Encounter (HOSPITAL_COMMUNITY): Payer: Self-pay

## 2022-11-23 DIAGNOSIS — R799 Abnormal finding of blood chemistry, unspecified: Secondary | ICD-10-CM | POA: Diagnosis not present

## 2022-11-23 DIAGNOSIS — I517 Cardiomegaly: Secondary | ICD-10-CM | POA: Diagnosis not present

## 2022-11-23 DIAGNOSIS — R002 Palpitations: Secondary | ICD-10-CM | POA: Diagnosis not present

## 2022-11-23 LAB — BASIC METABOLIC PANEL
Anion gap: 13 (ref 5–15)
BUN: 6 mg/dL (ref 6–20)
CO2: 26 mmol/L (ref 22–32)
Calcium: 9.1 mg/dL (ref 8.9–10.3)
Chloride: 98 mmol/L (ref 98–111)
Creatinine, Ser: 0.61 mg/dL (ref 0.44–1.00)
GFR, Estimated: >60 mL/min (ref >60)
Glucose, Bld: 115 mg/dL — ABNORMAL HIGH (ref 70–99)
Potassium: 3.5 mmol/L (ref 3.5–5.1)
Sodium: 137 mmol/L (ref 135–145)

## 2022-11-23 LAB — URINALYSIS, ROUTINE W REFLEX MICROSCOPIC
Bilirubin Urine: NEGATIVE
Glucose, UA: NEGATIVE mg/dL
Hgb urine dipstick: NEGATIVE
Ketones, ur: NEGATIVE mg/dL
Leukocytes,Ua: NEGATIVE
Nitrite: NEGATIVE
Protein, ur: NEGATIVE mg/dL
Specific Gravity, Urine: 1.014 (ref 1.005–1.030)
pH: 5 (ref 5.0–8.0)

## 2022-11-23 LAB — TROPONIN I (HIGH SENSITIVITY)
Troponin I (High Sensitivity): 9 ng/L (ref <18)
Troponin I (High Sensitivity): 9 ng/L (ref <18)

## 2022-11-23 LAB — CBC
HCT: 34.4 % — ABNORMAL LOW (ref 36.0–46.0)
Hemoglobin: 10.4 g/dL — ABNORMAL LOW (ref 12.0–15.0)
MCH: 22.1 pg — ABNORMAL LOW (ref 26.0–34.0)
MCHC: 30.2 g/dL (ref 30.0–36.0)
MCV: 73.2 fL — ABNORMAL LOW (ref 80.0–100.0)
Platelets: 369 10*3/uL (ref 150–400)
RBC: 4.7 MIL/uL (ref 3.87–5.11)
RDW: 17.8 % — ABNORMAL HIGH (ref 11.5–15.5)
WBC: 8.6 10*3/uL (ref 4.0–10.5)
nRBC: 0 % (ref 0.0–0.2)

## 2022-11-23 LAB — D-DIMER, QUANTITATIVE: D-Dimer, Quant: 1.09 ug/mL-FEU — ABNORMAL HIGH (ref 0.00–0.50)

## 2022-11-23 LAB — HCG, SERUM, QUALITATIVE: Preg, Serum: NEGATIVE

## 2022-11-23 MED ORDER — IOHEXOL 350 MG/ML SOLN
100.0000 mL | Freq: Once | INTRAVENOUS | Status: AC | PRN
  Administered 2022-11-23: 100 mL via INTRAVENOUS

## 2022-11-23 NOTE — ED Triage Notes (Addendum)
Pt arrived POV d/t Palpitations intermittently since Thursday but today it scared her - she was sitting up reading.  Pt took 162 mg PO ASA and 2 inhalations of Albuterol.  Pt does not feel at this time.

## 2022-11-23 NOTE — ED Provider Notes (Signed)
EMERGENCY DEPARTMENT AT [facility] Provider Note   CSN: [id number] Arrival date & time: 11/23/22  4098     History {Add pertinent medical, surgical, social history, OB history to HPI:1} Chief Complaint  Patient presents with   Palpitations    Caitlin Hill is a 32 y.o. female.  HPI     This is a 32 year old female who presents with palpitations.  Patient reports onset of palpitations on Thursday.  She describes heart fluttering.  No chest pain.  She has had recent bronchitis and has taken some albuterol but this does not seem to exacerbate her symptoms.  Denies any recent changes in medications, increased caffeine, alcohol or drug use.  Does not believe herself to be pregnant.  No history of arrhythmias.  No history of blood clots.  Does report that she has had some recent dysuria and was prescribed antibiotics but has not started that.  No fevers.  Home Medications Prior to Admission medications   Medication Sig Start Date End Date Taking? Authorizing Provider  megestrol (MEGACE) 40 MG tablet Take 1 tablet (40 mg total) by mouth daily. 07/28/20   Marny Lowenstein, PA-C      Allergies    Patient has no known allergies.    Review of Systems   Review of Systems  Constitutional:  Negative for fever.  Respiratory:  Negative for cough and shortness of breath.   Cardiovascular:  Positive for palpitations. Negative for chest pain.  Gastrointestinal:  Negative for anal bleeding, nausea and vomiting.  All other systems reviewed and are negative.   Physical Exam Updated Vital Signs BP (!) 161/72   Pulse 81   Temp 98.2 F (36.8 C) (Oral)   Resp 19   Ht 1.626 m (5\' 4" )   Wt (!) 210.5 kg   LMP  (LMP Unknown)   SpO2 99%   BMI 79.65 kg/m  Physical Exam Vitals and nursing note reviewed.  Constitutional:      Appearance: She is well-developed. She is obese. She is not ill-appearing.  HENT:     Head: Normocephalic and atraumatic.  Eyes:     Pupils:  Pupils are equal, round, and reactive to light.  Cardiovascular:     Rate and Rhythm: Normal rate and regular rhythm.     Heart sounds: Normal heart sounds.  Pulmonary:     Effort: Pulmonary effort is normal. No respiratory distress.     Breath sounds: No wheezing.  Abdominal:     Palpations: Abdomen is soft.     Tenderness: There is no abdominal tenderness. There is no guarding or rebound.  Musculoskeletal:     Cervical back: Neck supple.  Skin:    General: Skin is warm and dry.  Neurological:     Mental Status: She is alert and oriented to person, place, and time.  Psychiatric:        Mood and Affect: Mood normal.     ED Results / Procedures / Treatments   Labs (all labs ordered are listed, but only abnormal results are displayed) Labs Reviewed  BASIC METABOLIC PANEL - Abnormal; Notable for the following components:      Result Value   Glucose, Bld 115 (*)    All other components within normal limits  CBC - Abnormal; Notable for the following components:   Hemoglobin 10.4 (*)    HCT 34.4 (*)    MCV 73.2 (*)    MCH 22.1 (*)    RDW 17.8 (*)  All other components within normal limits  D-DIMER, QUANTITATIVE - Abnormal; Notable for the following components:   D-Dimer, Quant 1.09 (*)    All other components within normal limits  HCG, SERUM, QUALITATIVE  URINALYSIS, ROUTINE W REFLEX MICROSCOPIC  TROPONIN I (HIGH SENSITIVITY)  TROPONIN I (HIGH SENSITIVITY)    EKG EKG Interpretation Date/Time:  Saturday November 23 2022 03:20:04 EDT Ventricular Rate:  97 PR Interval:  186 QRS Duration:  80 QT Interval:  324 QTC Calculation: 411 R Axis:   73  Text Interpretation: Normal sinus rhythm T wave abnormality, consider inferior ischemia Abnormal ECG No previous ECGs available Confirmed by Ross Marcus ([id number]) on 11/23/2022 5:03:16 AM  Radiology DG Chest 2 View  Result Date: 11/23/2022 CLINICAL DATA:  Heart palpitations. EXAM: CHEST - 2 VIEW COMPARISON:  None Available.  FINDINGS: There is mild cardiomegaly. No evidence of CHF. The mediastinum is normally outlined. Both lungs are clear. The visualized skeletal structures are unremarkable. IMPRESSION: Mild cardiomegaly. No evidence of acute chest disease. Electronically Signed   By: Almira Bar M.D.   On: 11/23/2022 04:28    Procedures Procedures  {Document cardiac monitor, telemetry assessment procedure when appropriate:1}  Medications Ordered in ED Medications - No data to display  ED Course/ Medical Decision Making/ A&P   {   Click here for ABCD2, HEART and other calculatorsREFRESH Note before signing :1}                          Medical Decision Making Amount and/or Complexity of Data Reviewed Labs: ordered. Radiology: ordered.   ***  {Document critical care time when appropriate:1} {Document review of labs and clinical decision tools ie heart score, Chads2Vasc2 etc:1}  {Document your independent review of radiology images, and any outside records:1} {Document your discussion with family members, caretakers, and with consultants:1} {Document social determinants of health affecting pt's care:1} {Document your decision making why or why not admission, treatments were needed:1} Final Clinical Impression(s) / ED Diagnoses Final diagnoses:  None    Rx / DC Orders ED Discharge Orders     None

## 2022-11-23 NOTE — ED Provider Notes (Signed)
Handoff obtained from Dr. Wilkie Aye.  Briefly patient presenting for episode of palpitations.  Currently is asymptomatic.  She was mildly tachycardic and D-dimer was elevated.  CTA is pending.  Plan for discharge if normal.  CTA chest without signs of PE.  On my reevaluation she is asymptomatic and hemodynamically stable.  I think she is stable for close follow-up with PCP.  Strict return precautions given.   Laurence Spates, MD 11/23/22 [phone]

## 2023-03-18 ENCOUNTER — Other Ambulatory Visit: Payer: Self-pay

## 2023-03-18 ENCOUNTER — Encounter (HOSPITAL_COMMUNITY): Payer: Self-pay

## 2023-03-18 ENCOUNTER — Observation Stay (HOSPITAL_COMMUNITY)
Admission: EM | Admit: 2023-03-18 | Discharge: 2023-03-20 | Disposition: A | Discharge status: Home / Self Care | Payer: Medicaid Other | Attending: Internal Medicine | Admitting: Internal Medicine

## 2023-03-18 ENCOUNTER — Emergency Department (HOSPITAL_COMMUNITY): Payer: Medicaid Other

## 2023-03-18 DIAGNOSIS — R791 Abnormal coagulation profile: Secondary | ICD-10-CM | POA: Insufficient documentation

## 2023-03-18 DIAGNOSIS — I160 Hypertensive urgency: Secondary | ICD-10-CM | POA: Diagnosis not present

## 2023-03-18 DIAGNOSIS — G4730 Sleep apnea, unspecified: Secondary | ICD-10-CM | POA: Insufficient documentation

## 2023-03-18 DIAGNOSIS — D509 Iron deficiency anemia, unspecified: Secondary | ICD-10-CM | POA: Insufficient documentation

## 2023-03-18 DIAGNOSIS — R918 Other nonspecific abnormal finding of lung field: Secondary | ICD-10-CM | POA: Diagnosis not present

## 2023-03-18 DIAGNOSIS — N939 Abnormal uterine and vaginal bleeding, unspecified: Secondary | ICD-10-CM | POA: Diagnosis not present

## 2023-03-18 DIAGNOSIS — Z87891 Personal history of nicotine dependence: Secondary | ICD-10-CM | POA: Diagnosis not present

## 2023-03-18 DIAGNOSIS — R7989 Other specified abnormal findings of blood chemistry: Secondary | ICD-10-CM | POA: Insufficient documentation

## 2023-03-18 DIAGNOSIS — R778 Other specified abnormalities of plasma proteins: Secondary | ICD-10-CM | POA: Diagnosis not present

## 2023-03-18 DIAGNOSIS — R079 Chest pain, unspecified: Secondary | ICD-10-CM | POA: Diagnosis not present

## 2023-03-18 DIAGNOSIS — Z6841 Body Mass Index (BMI) 40.0 and over, adult: Secondary | ICD-10-CM | POA: Insufficient documentation

## 2023-03-18 DIAGNOSIS — R0789 Other chest pain: Principal | ICD-10-CM | POA: Insufficient documentation

## 2023-03-18 DIAGNOSIS — R072 Precordial pain: Principal | ICD-10-CM

## 2023-03-18 DIAGNOSIS — I517 Cardiomegaly: Secondary | ICD-10-CM | POA: Diagnosis not present

## 2023-03-18 DIAGNOSIS — K8020 Calculus of gallbladder without cholecystitis without obstruction: Secondary | ICD-10-CM | POA: Diagnosis not present

## 2023-03-18 LAB — CBC
HCT: 34.7 % — ABNORMAL LOW (ref 36.0–46.0)
Hemoglobin: 10.8 g/dL — ABNORMAL LOW (ref 12.0–15.0)
MCH: 23.8 pg — ABNORMAL LOW (ref 26.0–34.0)
MCHC: 31.1 g/dL (ref 30.0–36.0)
MCV: 76.6 fL — ABNORMAL LOW (ref 80.0–100.0)
Platelets: 380 10*3/uL (ref 150–400)
RBC: 4.53 MIL/uL (ref 3.87–5.11)
RDW: 17.2 % — ABNORMAL HIGH (ref 11.5–15.5)
WBC: 9 10*3/uL (ref 4.0–10.5)
nRBC: 0 % (ref 0.0–0.2)

## 2023-03-18 LAB — BASIC METABOLIC PANEL
Anion gap: 9 (ref 5–15)
BUN: 5 mg/dL — ABNORMAL LOW (ref 6–20)
CO2: 27 mmol/L (ref 22–32)
Calcium: 8.9 mg/dL (ref 8.9–10.3)
Chloride: 104 mmol/L (ref 98–111)
Creatinine, Ser: 0.58 mg/dL (ref 0.44–1.00)
GFR, Estimated: >60 mL/min (ref >60)
Glucose, Bld: 110 mg/dL — ABNORMAL HIGH (ref 70–99)
Potassium: 3.5 mmol/L (ref 3.5–5.1)
Sodium: 140 mmol/L (ref 135–145)

## 2023-03-18 LAB — HCG, SERUM, QUALITATIVE: Preg, Serum: NEGATIVE

## 2023-03-18 LAB — TROPONIN I (HIGH SENSITIVITY)
Troponin I (High Sensitivity): 12 ng/L (ref <18)
Troponin I (High Sensitivity): 27 ng/L — ABNORMAL HIGH (ref <18)

## 2023-03-18 NOTE — ED Notes (Signed)
Called for second trop no answer

## 2023-03-18 NOTE — ED Triage Notes (Signed)
Left sided chest pains beginning 2 days ago. Initially used a magnesium spray as t hat usually treats all her pain but had minimal improvement.   Also sts she has been using her albuterol for mild shob over last 2 days.

## 2023-03-19 ENCOUNTER — Emergency Department (HOSPITAL_COMMUNITY): Payer: Medicaid Other

## 2023-03-19 ENCOUNTER — Ambulatory Visit (HOSPITAL_COMMUNITY): Payer: Medicaid Other

## 2023-03-19 ENCOUNTER — Observation Stay (HOSPITAL_COMMUNITY): Payer: Medicaid Other

## 2023-03-19 ENCOUNTER — Observation Stay (HOSPITAL_BASED_OUTPATIENT_CLINIC_OR_DEPARTMENT_OTHER): Payer: Medicaid Other

## 2023-03-19 DIAGNOSIS — M7989 Other specified soft tissue disorders: Secondary | ICD-10-CM

## 2023-03-19 DIAGNOSIS — R079 Chest pain, unspecified: Secondary | ICD-10-CM

## 2023-03-19 LAB — ECHOCARDIOGRAM COMPLETE
AR max vel: 2.96 cm2
AV Area VTI: 3.03 cm2
AV Area mean vel: 2.94 cm2
AV Mean grad: 4 mm[Hg]
AV Peak grad: 7.8 mm[Hg]
Ao pk vel: 1.4 m/s
Area-P 1/2: 3.76 cm2
Height: 64 in
S' Lateral: 3.1 cm
Weight: 7440 [oz_av]

## 2023-03-19 LAB — RETICULOCYTES
Immature Retic Fract: 17.7 % — ABNORMAL HIGH (ref 2.3–15.9)
RBC.: 4.25 MIL/uL (ref 3.87–5.11)
Retic Count, Absolute: 67.6 10*3/uL (ref 19.0–186.0)
Retic Ct Pct: 1.6 % (ref 0.4–3.1)

## 2023-03-19 LAB — FOLATE: Folate: 8.1 ng/mL (ref >5.9)

## 2023-03-19 LAB — IRON AND TIBC
Iron: 37 ug/dL (ref 28–170)
Saturation Ratios: 11 % (ref 10.4–31.8)
TIBC: 350 ug/dL (ref 250–450)
UIBC: 313 ug/dL

## 2023-03-19 LAB — D-DIMER, QUANTITATIVE: D-Dimer, Quant: 0.93 ug{FEU}/mL — ABNORMAL HIGH (ref 0.00–0.50)

## 2023-03-19 LAB — FERRITIN: Ferritin: 26 ng/mL (ref 11–307)

## 2023-03-19 LAB — HIV ANTIBODY (ROUTINE TESTING W REFLEX): HIV Screen 4th Generation wRfx: NONREACTIVE

## 2023-03-19 LAB — TROPONIN I (HIGH SENSITIVITY): Troponin I (High Sensitivity): 25 ng/L — ABNORMAL HIGH (ref <18)

## 2023-03-19 LAB — VITAMIN B12: Vitamin B-12: 303 pg/mL (ref 180–914)

## 2023-03-19 MED ORDER — ENOXAPARIN SODIUM 100 MG/ML IJ SOSY
100.0000 mg | PREFILLED_SYRINGE | INTRAMUSCULAR | Status: DC
  Administered 2023-03-19 – 2023-03-20 (×2): 100 mg via SUBCUTANEOUS
  Filled 2023-03-19 (×2): qty 1

## 2023-03-19 MED ORDER — ACETAMINOPHEN 650 MG RE SUPP: 650.0000 mg | Freq: Four times a day (QID) | RECTAL | Status: DC | PRN

## 2023-03-19 MED ORDER — ACETAMINOPHEN 325 MG PO TABS: 650.0000 mg | ORAL_TABLET | Freq: Four times a day (QID) | ORAL | Status: DC | PRN

## 2023-03-19 MED ORDER — ASPIRIN 81 MG PO CHEW
324.0000 mg | CHEWABLE_TABLET | Freq: Once | ORAL | Status: AC
  Administered 2023-03-19: 324 mg via ORAL
  Filled 2023-03-19: qty 4

## 2023-03-19 MED ORDER — OXYCODONE HCL 5 MG PO TABS
5.0000 mg | ORAL_TABLET | ORAL | Status: DC | PRN
Start: 2023-03-19 — End: 2023-03-20

## 2023-03-19 MED ORDER — IOHEXOL 350 MG/ML SOLN
150.0000 mL | Freq: Once | INTRAVENOUS | Status: AC | PRN
  Administered 2023-03-19: 150 mL via INTRAVENOUS

## 2023-03-19 MED ORDER — CARVEDILOL 3.125 MG PO TABS
3.1250 mg | ORAL_TABLET | Freq: Two times a day (BID) | ORAL | Status: DC
  Administered 2023-03-19 (×2): 3.125 mg via ORAL
  Filled 2023-03-19 (×2): qty 1

## 2023-03-19 MED ORDER — HYDRALAZINE HCL 20 MG/ML IJ SOLN: 10.0000 mg | Freq: Four times a day (QID) | INTRAMUSCULAR | Status: DC | PRN

## 2023-03-19 MED ORDER — ALBUTEROL SULFATE (2.5 MG/3ML) 0.083% IN NEBU: 2.5000 mg | INHALATION_SOLUTION | Freq: Four times a day (QID) | RESPIRATORY_TRACT | Status: DC | PRN

## 2023-03-19 NOTE — H&P (Signed)
[facility] History and Physical  Caitlin Hill LKG:[id number] DOB: [birth year] DOA: 03/18/2023 PCP: [facility]  Presented from: Home Chief Complaint: Shortness of breath, chest pain  History of Present Illness: Caitlin Hill is a 32 y.o. female with super morbid obesity of BMI 80, history of abnormal uterine bleeding 11/19, patient presented to the ED with complaint of left-sided chest pain for 2 days.   She reports she used magnesium spray with partial relief.  Also reports shortness of breath requiring more frequent use of albuterol.  Endorses increased stress this week. No fever, cough Also reports that she started develop swelling in her lower extremities while she was waiting in the ED lobby.  Denies prior pedal edema.  In the ED, patient was afebrile, heart rate in 90s, blood pressure initially was super elevated at 202/104, breathing on room air Initial labs with WC count normal, hemoglobin 10.8, renal function normal, Troponin 12>27>25 D-dimer was elevated to 0.93 CT angio chest was obtained which showed suboptimal opacification of the pulmonary arteries in his the possibility of pulmonary embolism could not be excluded. In the ED, patient was given 1 dose of aspirin 324 mg Hospitalist service was consulted for observation and management  I received this patient as a carryover admission from last night At the time of my evaluation, patient was propped up in bed.  Not in distress.  Symptoms gradually improving.  Her daughter was at bedside.  Review of Systems:  All systems were reviewed and were negative unless otherwise mentioned in the HPI   Past medical history: Past Medical History:  Diagnosis Date   Medical history non-contributory     Past surgical history: Past Surgical History:  Procedure Laterality Date   NO PAST SURGERIES      Social History:  reports that she quit smoking about 5 years ago. She has never used smokeless  tobacco. She reports that she does not drink alcohol and does not use drugs.  Allergies:  No Known Allergies Patient has no known allergies.   Family history:  History reviewed. No pertinent family history.   Physical Exam: Vitals:   03/19/23 0930 03/19/23 0945 03/19/23 1000 03/19/23 1015  BP: (!) 149/86  (!) 145/80   Pulse: 83 71 80 79  Resp: (!) 21 16 (!) 24 18  Temp:      TempSrc:      SpO2: 100% 97% 100% 96%  Weight:      Height:       Wt Readings from Last 3 Encounters:  03/18/23 (!) 210.9 kg  11/23/22 (!) 210.5 kg  07/28/20 (!) 190.1 kg   Body mass index is 79.82 kg/m.  General exam: Pleasant, young morbidly obese female Skin: No rashes, lesions or ulcers. HEENT: Atraumatic, normocephalic, no obvious bleeding Lungs: Diminished air entry in both bases, otherwise clear to auscultation bilaterally CVS: Regular rate and rhythm, no murmur GI/Abd soft, nontender, distended from obesity, bowel sound present CNS: Alert, awake, oriented x 3 Psychiatry: Mood appropriate Extremities: Trace to 1+ bilateral pedal edema noted   ------------------------------------------------------------------------------------------------------ Assessment/Plan: Principal Problem:   Chest pain  Chest pain Elevated troponin Patient with progressive left-sided chest pain and shortness of breath for 2 days Troponin mildly elevated as below.  EKG without ischemic changes Less likely ACS but her super obesity poses significant risk for coronary disease Cardiology was consulted from ED. Aspirin 324 milligram was given in the ED. Echocardiogram ordered to rule out wall motion abnormality. Recent Labs  03/18/23 1952 03/18/23 2217 03/19/23 0559  TROPONINIHS 12 27* 25*   Hypertensive urgency Initial blood pressure was very high at 202/104.  Patient denies history of hypertension and does not monitor blood pressure at home on a regular basis. It is possible that she had elevated blood  pressure for last 2 to 3 days giving her chest pain shortness of breath. Blood pressure seems to be spontaneously improving.  In 140s this morning. Not on any antihypertensives at home.  Elevated D-dimer D-dimer was elevated to 0.93 raising suspicion of pulm embolism.  Also reported pedal edema CT angio chest was suboptimal and could not rule out possibly a pulm embolism.  Obtain VQ scan Obtain ultrasound duplex of lower extremities  Morbid Obesity  Body mass index is 79.82 kg/m. Patient has been advised to make an attempt to improve diet and exercise patterns to aid in weight loss.  Suspect sleep apnea Reports waking up at night short of breath last week.  Outpatient sleep study advised.  Chronic microcytic anemia H/o abnormal uterine bleeding Reports she has not had a menstrual bleeding in the last several months.   Pregnancy test was negative.   Obtain anemia panel Recent Labs    11/23/22 0341 03/18/23 1952  HGB 10.4* 10.8*  MCV 73.2* 76.6*    Mobility: Encourage ambulation  Goals of care   Code Status: Full Code    DVT prophylaxis: Lovenox subcu   Antimicrobials: None Fluid: None Consultants: Cardiology Family Communication: Daughter at bedside  Dispo: The patient is from: Home              Anticipated d/c is to: Home, in 1 to 2 days  Diet: Diet Order     None        ------------------------------------------------------------------------------------- Severity of Illness: The appropriate patient status for this patient is OBSERVATION. Observation status is judged to be reasonable and necessary in order to provide the required intensity of service to ensure the patient's safety. The patient's presenting symptoms, physical exam findings, and initial radiographic and laboratory data in the context of their medical condition is felt to place them at decreased risk for further clinical deterioration. Furthermore, it is anticipated that the patient will be  medically stable for discharge from the hospital within 2 midnights of admission.    Home Meds: Prior to Admission medications   Medication Sig Start Date End Date Taking? Authorizing Provider  VENTOLIN HFA 108 (90 Base) MCG/ACT inhaler Inhale 2 puffs into the lungs every 4 (four) hours as needed for wheezing or shortness of breath. 11/21/22  Yes [provider]    Labs on Admission:   CBC: Recent Labs  Lab 03/18/23 1952  WBC 9.0  HGB 10.8*  HCT 34.7*  MCV 76.6*  PLT 380    Basic Metabolic Panel: Recent Labs  Lab 03/18/23 1952  NA 140  K 3.5  CL 104  CO2 27  GLUCOSE 110*  BUN 5*  CREATININE 0.58  CALCIUM 8.9    Liver Function Tests: No results for input(s): "AST", "ALT", "ALKPHOS", "BILITOT", "PROT", "ALBUMIN" in the last 168 hours. No results for input(s): "LIPASE", "AMYLASE" in the last 168 hours. No results for input(s): "AMMONIA" in the last 168 hours.  Cardiac Enzymes: No results for input(s): "CKTOTAL", "CKMB", "CKMBINDEX", "TROPONINI" in the last 168 hours.  BNP (last 3 results) No results for input(s): "BNP" in the last 8760 hours.  ProBNP (last 3 results) No results for input(s): "PROBNP" in the last 8760 hours.  CBG: No results for input(s): "GLUCAP" in the last 168 hours.  Lipase  No results found for: "LIPASE"   Urinalysis    Component Value Date/Time   COLORURINE YELLOW 11/23/2022 0900   APPEARANCEUR CLEAR 11/23/2022 0900   LABSPEC 1.014 11/23/2022 0900   PHURINE 5.0 11/23/2022 0900   GLUCOSEU NEGATIVE 11/23/2022 0900   HGBUR NEGATIVE 11/23/2022 0900   BILIRUBINUR NEGATIVE 11/23/2022 0900   KETONESUR NEGATIVE 11/23/2022 0900   PROTEINUR NEGATIVE 11/23/2022 0900   UROBILINOGEN 0.2 06/26/2018 0947   NITRITE NEGATIVE 11/23/2022 0900   LEUKOCYTESUR NEGATIVE 11/23/2022 0900     Drugs of Abuse  No results found for: "LABOPIA", "COCAINSCRNUR", "LABBENZ", "AMPHETMU", "THCU", "LABBARB"    Radiological Exams on Admission: CT  Angio Chest PE W/Cm &/Or Wo Cm  Result Date: 03/19/2023 CLINICAL DATA:  Pulmonary embolism suspected, high probability. Left-sided chest pain beginning 2 days ago. EXAM: CT ANGIOGRAPHY CHEST WITH CONTRAST TECHNIQUE: Multidetector CT imaging of the chest was performed using the standard protocol during bolus administration of intravenous contrast. Multiplanar CT image reconstructions and MIPs were obtained to evaluate the vascular anatomy. RADIATION DOSE REDUCTION: This exam was performed according to the departmental dose-optimization program which includes automated exposure control, adjustment of the mA and/or kV according to patient size and/or use of iterative reconstruction technique. CONTRAST:  OMNIPAQUE IOHEXOL 350 MG/ML SOLN COMPARISON:  11/23/2022. FINDINGS: Cardiovascular: Preferential opacification of the thoracic aorta. The possibility of pulmonary embolism can not be excluded. No evidence of thoracic aortic aneurysm or dissection. Normal heart size. No pericardial effusion. Mediastinum/Nodes: No enlarged mediastinal, hilar, or axillary lymph nodes. Thyroid gland, trachea, and esophagus demonstrate no significant findings. Lungs/Pleura: Lungs are clear. No pleural effusion or pneumothorax. Upper Abdomen: Stones are present within the gallbladder. No acute abnormality. Musculoskeletal: No acute osseous abnormality. Review of the MIP images confirms the above findings. IMPRESSION: 1. Suboptimal opacification of the pulmonary arteries. The possibility of pulmonary embolism can not be excluded. The need for repeat evaluation or V/Q scan should be determined clinically. 2. No acute process in the chest. 3. Cholelithiasis. Electronically Signed   By: Thornell Sartorius M.D.   On: 03/19/2023 04:53   DG Chest 2 View  Result Date: 03/18/2023 CLINICAL DATA:  Chest pain EXAM: CHEST - 2 VIEW COMPARISON:  Radiograph and CT 11/23/2018 FINDINGS: Mild cardiomegaly is unchanged from prior exam. Mild  peribronchial thickening without focal airspace disease. No pleural effusion or pneumothorax. No pulmonary edema. No acute osseous findings. IMPRESSION: 1. Mild cardiomegaly, unchanged since July. 2. Mild peribronchial thickening without focal airspace disease. Electronically Signed   By: Narda Rutherford M.D.   On: 03/18/2023 22:55     Signed, Lorin Glass, MD [facility] 03/19/2023

## 2023-03-19 NOTE — Consult Note (Addendum)
Cardiology Consultation   Patient ID: Caitlin Hill MRN: [id number]; DOB: 1990/06/27  Admit date: 03/18/2023 Date of Consult: 03/19/2023  PCP:  [facility]   [facility] Providers Cardiologist:  None  - new this admission    Patient Profile:   Caitlin Hill is a 32 y.o. female with a hx of abnormal uterine bleeding, obesity who is being seen 03/19/2023 for the evaluation of chest pain at the request of Dr. Antionette Char.  History of Present Illness:   Caitlin Hill is a 32 year old female with a past medical history of abnormal uterine bleeding. Per chart review, she does not have any past cardiac history and does not follow with cardiology.   She presented to the ED on 11/19 complaining of left sided chest pains that began 2 days prior. She also reported mild shortness of breath for the past 2 days. In the ED, initial vital signs showed BP 202/104, oxygen 97% on room air, HR 96 BPM. EKG showed normal sinus rhythm with HR 91 BPM, no ischemic changes. Labs showed Na 140, K 3.5, creatinine 0.58, WBC 9.0, hemoglobin 10.8, platelets 380. hsTn 12>27>25. CXR showed mild cardiomegaly, unchanged since 10/2022, mild peribronchial thickening. D-Dimer was elevated. CTA chest was a suboptimal study that could not rule out PE.   Patient was admitted to the internal medicine service. Cardiology consulted for evaluation of chest pain.   On interview, patient denies past cardiac history. She denies family history of heart problems. Does not drink alcohol, take drugs, or use tobacco products. She reports that her BP is often in the 140s/90s at home, but she is not on any medications for hypertension. She came to the ED with shortness of breath and left sided chest pain. Reports having a prescription for albuterol because she often gets short of breath when she is exposed to smoke. She also gets short of breath easily if she develops a URI. She was at a family gathering on the  17th and was exposed to cigarette smoke. Since then, she had mild shortness of breath. Yesterday evening, she was getting a lot of phone calls all at once and got very stressed. She developed an aching pain in her left chest. Did not radiate. Was not associated with shortness of breath, nausea, dizziness, syncope, near syncope. She put some magnesium oil on her chest, and the pain went away. However, she continued to feel "uncomfortable" in her chest. Pain has since resolved    Past Medical History:  Diagnosis Date   Medical history non-contributory     Past Surgical History:  Procedure Laterality Date   NO PAST SURGERIES       Home Medications:  Prior to Admission medications   Medication Sig Start Date End Date Taking? Authorizing Provider  VENTOLIN HFA 108 (90 Base) MCG/ACT inhaler Inhale 2 puffs into the lungs every 4 (four) hours as needed for wheezing or shortness of breath. 11/21/22  Yes [provider]    Inpatient Medications: Scheduled Meds:  enoxaparin (LOVENOX) injection  100 mg Subcutaneous Q24H   Continuous Infusions:  PRN Meds: acetaminophen **OR** acetaminophen, albuterol, hydrALAZINE, oxyCODONE  Allergies:   No Known Allergies  Social History:   Social History   Socioeconomic History   Marital status: Single    Spouse name: Not on file   Number of children: Not on file   Years of education: Not on file   Highest education level: Not on file  Occupational History  Not on file  Tobacco Use   Smoking status: Former    Current packs/day: 0.00    Types: Cigarettes    Quit date: 03/29/2017    Years since quitting: 5.9   Smokeless tobacco: Never  Substance and Sexual Activity   Alcohol use: No   Drug use: No   Sexual activity: Yes  Other Topics Concern   Not on file  Social History Narrative   Not on file   Social Determinants of Health   Financial Resource Strain: Not on file  Food Insecurity: Not on file  Transportation Needs: Not on  file  Physical Activity: Not on file  Stress: Not on file  Social Connections: Not on file  Intimate Partner Violence: Not on file    Family History:   History reviewed. No pertinent family history.   ROS:  Please see the history of present illness.   All other ROS reviewed and negative.     Physical Exam/Data:   Vitals:   03/19/23 0254 03/19/23 0725 03/19/23 0727 03/19/23 0800  BP: (!) 154/75 133/64  123/62  Pulse: 73 75  81  Resp: 17 15  17   Temp: 97.7 F (36.5 C)  97.7 F (36.5 C)   TempSrc: Oral  Oral   SpO2: 100% 100%  97%  Weight:      Height:       No intake or output data in the 24 hours ending 03/19/23 0913    03/18/2023    7:46 PM 11/23/2022    3:37 AM 07/28/2020    8:27 AM  Last 3 Weights  Weight (lbs) 465 lb 464 lb 419 lb 3.2 oz  Weight (kg) 210.923 kg 210.469 kg 190.148 kg     Body mass index is 79.82 kg/m.  General:  Well nourished, well developed, in no acute distress. Sitting upright in the bed  HEENT: normal Neck: no JVD Vascular: Radial pulses 2+ bilaterally Cardiac:  normal S1, S2; RRR; no murmur  Lungs:  clear to auscultation bilaterally, no wheezing, rhonchi or rales. Normal work of breathing on room air  Abd: soft, nontender  Ext: no edema in BLE  Musculoskeletal:  No deformities, BUE and BLE strength normal and equal Skin: warm and dry  Neuro:  CNs 2-12 intact, no focal abnormalities noted Psych:  Normal affect   EKG:  The EKG was personally reviewed and demonstrates:  NSR Telemetry:  Telemetry was personally reviewed and demonstrates:  NSR   Relevant CV Studies:   Laboratory Data:  High Sensitivity Troponin:   Recent Labs  Lab 03/18/23 1952 03/18/23 2217 03/19/23 0559  TROPONINIHS 12 27* 25*     Chemistry Recent Labs  Lab 03/18/23 1952  NA 140  K 3.5  CL 104  CO2 27  GLUCOSE 110*  BUN 5*  CREATININE 0.58  CALCIUM 8.9  GFRNONAA >60  ANIONGAP 9    No results for input(s): "PROT", "ALBUMIN", "AST", "ALT",  "ALKPHOS", "BILITOT" in the last 168 hours. Lipids No results for input(s): "CHOL", "TRIG", "HDL", "LABVLDL", "LDLCALC", "CHOLHDL" in the last 168 hours.  Hematology Recent Labs  Lab 03/18/23 1952  WBC 9.0  RBC 4.53  HGB 10.8*  HCT 34.7*  MCV 76.6*  MCH 23.8*  MCHC 31.1  RDW 17.2*  PLT 380   Thyroid No results for input(s): "TSH", "FREET4" in the last 168 hours.  BNPNo results for input(s): "BNP", "PROBNP" in the last 168 hours.  DDimer  Recent Labs  Lab 03/19/23 0559  DDIMER 0.93*  Radiology/Studies:  CT Angio Chest PE W/Cm &/Or Wo Cm  Result Date: 03/19/2023 CLINICAL DATA:  Pulmonary embolism suspected, high probability. Left-sided chest pain beginning 2 days ago. EXAM: CT ANGIOGRAPHY CHEST WITH CONTRAST TECHNIQUE: Multidetector CT imaging of the chest was performed using the standard protocol during bolus administration of intravenous contrast. Multiplanar CT image reconstructions and MIPs were obtained to evaluate the vascular anatomy. RADIATION DOSE REDUCTION: This exam was performed according to the departmental dose-optimization program which includes automated exposure control, adjustment of the mA and/or kV according to patient size and/or use of iterative reconstruction technique. CONTRAST:  OMNIPAQUE IOHEXOL 350 MG/ML SOLN COMPARISON:  11/23/2022. FINDINGS: Cardiovascular: Preferential opacification of the thoracic aorta. The possibility of pulmonary embolism can not be excluded. No evidence of thoracic aortic aneurysm or dissection. Normal heart size. No pericardial effusion. Mediastinum/Nodes: No enlarged mediastinal, hilar, or axillary lymph nodes. Thyroid gland, trachea, and esophagus demonstrate no significant findings. Lungs/Pleura: Lungs are clear. No pleural effusion or pneumothorax. Upper Abdomen: Stones are present within the gallbladder. No acute abnormality. Musculoskeletal: No acute osseous abnormality. Review of the MIP images confirms the above  findings. IMPRESSION: 1. Suboptimal opacification of the pulmonary arteries. The possibility of pulmonary embolism can not be excluded. The need for repeat evaluation or V/Q scan should be determined clinically. 2. No acute process in the chest. 3. Cholelithiasis. Electronically Signed   By: Thornell Sartorius M.D.   On: 03/19/2023 04:53   DG Chest 2 View  Result Date: 03/18/2023 CLINICAL DATA:  Chest pain EXAM: CHEST - 2 VIEW COMPARISON:  Radiograph and CT 11/23/2018 FINDINGS: Mild cardiomegaly is unchanged from prior exam. Mild peribronchial thickening without focal airspace disease. No pleural effusion or pneumothorax. No pulmonary edema. No acute osseous findings. IMPRESSION: 1. Mild cardiomegaly, unchanged since July. 2. Mild peribronchial thickening without focal airspace disease. Electronically Signed   By: Narda Rutherford M.D.   On: 03/18/2023 22:55     Assessment and Plan:   Chest Pain  - Patient reports that yesterday, she was trying to take a nap but got several phone calls in a row. She has been under quite a bit of stress recently, and the phone calls worsened her stress. She started to feel pain in her left chest. Did not radiate. Improved a bit with magnesium oil. No nausea, vomiting, headache.  - In the ED, initial BP 202/104. BP has since improved  - Chest pain has resolved. She denies recurrence of chest pain. She denies recent chest pain on exertion  - EKG without ischemic changes. hsTn 12>27>25 - Chest pain is atypical as it occurred when she was at rest and was triggered by emotional stress. Also occurred when BP severely elevated. I do not think that ischemic evaluation is needed at this time. Additionally, with patient's BMI of 80, unlikely nuclear stress test or coronary CTA would be high quality and would likely not be interpretable.  - Echocardiogram ordered   Elevated Trop - hsTn 12>27>25  - Flat and minimally elevated- suspect this is demand ischemia with severely elevated  BP on arrival  - Echo pending   HTN  - Patient reports that her BP is often in 140s/90s  - BP elevated to 202/104 on arrival  - Start carvedilol 3.125 mg BID. Titrate as able   Obesity  - BMI 80 - As an outpatient, consider referral for GLP1   Risk Assessment/Risk Scores:      For questions or updates, please contact [facility] Please  consult [email or web address] for contact info under    Signed, Jonita Albee, PA-C  03/19/2023 9:13 AM  Patient seen and examined with KJ PA-C.  Agree as above, with the following exceptions and changes as noted below.  Patient notes episodic chest pain with stress and initial ED BP quite elevated.  Patient tells me she was unable to complete the VQ scan due to table weight limits.  She is currently getting DVT ultrasound by vascular sonographer.  Gen: NAD, CV: RRR, no murmurs, Lungs: clear, Abd: soft, Extrem: Warm, well perfused, puffy edema, Neuro/Psych: alert and oriented x 3, normal mood and affect. All available labs, radiology testing, previous records reviewed.  Patient is noted to have chest pain with stress and presenting with a very elevated BP.  Troponins minimally elevated in the setting of this blood pressure.  Unfortunately not an optimal candidate for imaging modalities due to weight.  CT of the chest was performed with no definite coronary artery calcifications and no definite PE but could not be entirely excluded due to limitations of imaging.  At the present time we will obtain an echocardiogram to further risk stratify.  If normal can consider outpatient stress testing, she may be a reasonable candidate for cardiac PET if she has a recurrence of chest pain.  Agree with medication therapy as above.  CT performed in the ED seems to exclude coarctation reasonably well as a source of hypertension.  I would recommend referring her as an outpatient for GLP-1 agonist therapy for weight management. Parke Poisson, MD 03/19/23 11:33  AM

## 2023-03-19 NOTE — ED Notes (Signed)
Pt ambulatory to bathroom

## 2023-03-19 NOTE — ED Provider Notes (Signed)
[facility] AT [facility] Provider Note   CSN: [id number] Arrival date & time: 03/18/23  1919     History  Chief Complaint  Patient presents with   Chest Pain    Caitlin Hill is a 32 y.o. female.  The history is provided by the patient and medical records.  Chest Pain Caitlin Hill is a 31 y.o. female who presents to the Emergency Department complaining of chest pain.  She presents to the emergency department for evaluation of left-sided chest pain described as a discomfort.  She did use a magnesium spray and the pain improved but did not fully resolve.  She does report increased stress this week.  No fever, cough.  She did have some difficulty breathing 2 days ago as well as today.  After using her albuterol her difficulty breathing resolved.  She also reports that her leg started swelling during her wait in the ED lobby but no prior lower extremity edema.   Home Medications Prior to Admission medications   Medication Sig Start Date End Date Taking? Authorizing Provider  VENTOLIN HFA 108 (90 Base) MCG/ACT inhaler Inhale 2 puffs into the lungs every 4 (four) hours as needed for wheezing or shortness of breath. 11/21/22  Yes [provider]      Allergies    Patient has no known allergies.    Review of Systems   Review of Systems  Cardiovascular:  Positive for chest pain.    Physical Exam Updated Vital Signs BP 133/64   Pulse 75   Temp 97.7 F (36.5 C) (Oral)   Resp 15   Ht 5\' 4"  (1.626 m)   Wt (!) 210.9 kg   LMP  (LMP Unknown)   SpO2 100%   BMI 79.82 kg/m  Physical Exam  ED Results / Procedures / Treatments   Labs (all labs ordered are listed, but only abnormal results are displayed) Labs Reviewed  BASIC METABOLIC PANEL - Abnormal; Notable for the following components:      Result Value   Glucose, Bld 110 (*)    BUN 5 (*)    All other components within normal limits  CBC - Abnormal; Notable for the following  components:   Hemoglobin 10.8 (*)    HCT 34.7 (*)    MCV 76.6 (*)    MCH 23.8 (*)    RDW 17.2 (*)    All other components within normal limits  D-DIMER, QUANTITATIVE - Abnormal; Notable for the following components:   D-Dimer, Quant 0.93 (*)    All other components within normal limits  TROPONIN I (HIGH SENSITIVITY) - Abnormal; Notable for the following components:   Troponin I (High Sensitivity) 27 (*)    All other components within normal limits  HCG, SERUM, QUALITATIVE  TROPONIN I (HIGH SENSITIVITY)  TROPONIN I (HIGH SENSITIVITY)    EKG EKG Interpretation Date/Time:  Tuesday March 18 2023 19:36:18 EST Ventricular Rate:  91 PR Interval:  192 QRS Duration:  76 QT Interval:  356 QTC Calculation: 437 R Axis:   73  Text Interpretation: Normal sinus rhythm Normal ECG Confirmed by Tilden Fossa [phone]) on 03/19/2023 1:50:06 AM  Radiology CT Angio Chest PE W/Cm &/Or Wo Cm  Result Date: 03/19/2023 CLINICAL DATA:  Pulmonary embolism suspected, high probability. Left-sided chest pain beginning 2 days ago. EXAM: CT ANGIOGRAPHY CHEST WITH CONTRAST TECHNIQUE: Multidetector CT imaging of the chest was performed using the standard protocol during bolus administration of intravenous contrast. Multiplanar CT image  reconstructions and MIPs were obtained to evaluate the vascular anatomy. RADIATION DOSE REDUCTION: This exam was performed according to the departmental dose-optimization program which includes automated exposure control, adjustment of the mA and/or kV according to patient size and/or use of iterative reconstruction technique. CONTRAST:  OMNIPAQUE IOHEXOL 350 MG/ML SOLN COMPARISON:  11/23/2022. FINDINGS: Cardiovascular: Preferential opacification of the thoracic aorta. The possibility of pulmonary embolism can not be excluded. No evidence of thoracic aortic aneurysm or dissection. Normal heart size. No pericardial effusion. Mediastinum/Nodes: No enlarged mediastinal, hilar, or  axillary lymph nodes. Thyroid gland, trachea, and esophagus demonstrate no significant findings. Lungs/Pleura: Lungs are clear. No pleural effusion or pneumothorax. Upper Abdomen: Stones are present within the gallbladder. No acute abnormality. Musculoskeletal: No acute osseous abnormality. Review of the MIP images confirms the above findings. IMPRESSION: 1. Suboptimal opacification of the pulmonary arteries. The possibility of pulmonary embolism can not be excluded. The need for repeat evaluation or V/Q scan should be determined clinically. 2. No acute process in the chest. 3. Cholelithiasis. Electronically Signed   By: Thornell Sartorius M.D.   On: 03/19/2023 04:53   DG Chest 2 View  Result Date: 03/18/2023 CLINICAL DATA:  Chest pain EXAM: CHEST - 2 VIEW COMPARISON:  Radiograph and CT 11/23/2018 FINDINGS: Mild cardiomegaly is unchanged from prior exam. Mild peribronchial thickening without focal airspace disease. No pleural effusion or pneumothorax. No pulmonary edema. No acute osseous findings. IMPRESSION: 1. Mild cardiomegaly, unchanged since July. 2. Mild peribronchial thickening without focal airspace disease. Electronically Signed   By: Narda Rutherford M.D.   On: 03/18/2023 22:55    Procedures Procedures    Medications Ordered in ED Medications  iohexol (OMNIPAQUE) 350 MG/ML injection 150 mL (150 mLs Intravenous Contrast Given 03/19/23 0435)  aspirin chewable tablet 324 mg (324 mg Oral Given 03/19/23 1914)    ED Course/ Medical Decision Making/ A&P                                 Medical Decision Making Amount and/or Complexity of Data Reviewed Labs: ordered. Radiology: ordered.  Risk OTC drugs. Prescription drug management. Decision regarding hospitalization.   Patient here for evaluation of chest pain, shortness of breath.  EKG is without acute ischemic changes.  She was hypertensive at time of ED presentation but her blood pressure did improve.  Initial troponin is within normal  limits, repeat troponin is mildly elevated.  Given her symptoms a CTA PE study was obtained, CTA is suboptimal and cannot rule out PE.  Discussed with on-call cardiologist, recommendation for medicine admission for further cardiac evaluation.  Patient pain-free on reassessment.  Given elevated troponin, indeterminate CTA study plan to admit for ongoing evaluation and workup.  Patient updated Darlis Loan studies and she is in agreement with treatment plan.  Hospitalist consulted for admission.        Final Clinical Impression(s) / ED Diagnoses Final diagnoses:  Precordial chest pain  Elevated troponin    Rx / DC Orders ED Discharge Orders     None         Tilden Fossa, MD 03/19/23 [phone]

## 2023-03-19 NOTE — ED Notes (Addendum)
Pt transported to vascular, well appearing upon transport. PT will go to floor room after vascular is complete.

## 2023-03-19 NOTE — Progress Notes (Signed)
*  PRELIMINARY RESULTS* Echocardiogram 2D Echocardiogram has been performed.  Caitlin Hill 03/19/2023, 3:29 PM

## 2023-03-20 DIAGNOSIS — R079 Chest pain, unspecified: Secondary | ICD-10-CM | POA: Diagnosis not present

## 2023-03-20 LAB — BASIC METABOLIC PANEL
Anion gap: 6 (ref 5–15)
BUN: 5 mg/dL — ABNORMAL LOW (ref 6–20)
CO2: 28 mmol/L (ref 22–32)
Calcium: 8.4 mg/dL — ABNORMAL LOW (ref 8.9–10.3)
Chloride: 103 mmol/L (ref 98–111)
Creatinine, Ser: 0.52 mg/dL (ref 0.44–1.00)
GFR, Estimated: >60 mL/min (ref >60)
Glucose, Bld: 105 mg/dL — ABNORMAL HIGH (ref 70–99)
Potassium: 3.4 mmol/L — ABNORMAL LOW (ref 3.5–5.1)
Sodium: 137 mmol/L (ref 135–145)

## 2023-03-20 LAB — CBC
HCT: 31.8 % — ABNORMAL LOW (ref 36.0–46.0)
Hemoglobin: 9.9 g/dL — ABNORMAL LOW (ref 12.0–15.0)
MCH: 24.2 pg — ABNORMAL LOW (ref 26.0–34.0)
MCHC: 31.1 g/dL (ref 30.0–36.0)
MCV: 77.8 fL — ABNORMAL LOW (ref 80.0–100.0)
Platelets: 323 10*3/uL (ref 150–400)
RBC: 4.09 MIL/uL (ref 3.87–5.11)
RDW: 17.2 % — ABNORMAL HIGH (ref 11.5–15.5)
WBC: 7.2 10*3/uL (ref 4.0–10.5)
nRBC: 0 % (ref 0.0–0.2)

## 2023-03-20 MED ORDER — CARVEDILOL 6.25 MG PO TABS: 6.2500 mg | ORAL_TABLET | Freq: Two times a day (BID) | ORAL | 2 refills | Status: AC

## 2023-03-20 MED ORDER — CARVEDILOL 6.25 MG PO TABS
6.2500 mg | ORAL_TABLET | Freq: Two times a day (BID) | ORAL | Status: DC
  Administered 2023-03-20: 6.25 mg via ORAL
  Filled 2023-03-20: qty 1

## 2023-03-20 MED ORDER — FERROUS SULFATE 325 (65 FE) MG PO TABS: 325.0000 mg | ORAL_TABLET | Freq: Every day | ORAL | 0 refills | Status: AC

## 2023-03-20 NOTE — Discharge Summary (Signed)
Physician Discharge Summary  Caitlin Hill JXB:[id number] DOB: 12-07-1990 DOA: 03/18/2023  PCP: [facility]  Admit date: 03/18/2023 Discharge date: 03/20/2023  Admitted From: Home Discharge disposition: Home  Recommendations at discharge:  You been started on carvedilol 6.25 mg twice daily for blood pressure control. Monitor blood pressure at home. Follow-up with cardiology as an outpatient  History of Present Illness / Brief narrative:  Caitlin Hill is a 32 y.o. female with super morbid obesity of BMI 80, history of abnormal uterine bleeding 11/19, patient presented to the ED with complaint of left-sided chest pain for 2 days.   She reports she used magnesium spray with partial relief.  Also reports shortness of breath requiring more frequent use of albuterol.  Endorses increased stress this week. No fever, cough Also reports that she started develop swelling in her lower extremities while she was waiting in the ED lobby.  Denies prior pedal edema.   In the ED, patient was afebrile, heart rate in 90s, blood pressure initially was super elevated at 202/104, breathing on room air Initial labs with WC count normal, hemoglobin 10.8, renal function normal, Troponin 12>27>25 D-dimer was elevated to 0.93 CT angio chest was obtained which showed suboptimal opacification of the pulmonary arteries in his the possibility of pulmonary embolism could not be excluded. In the ED, patient was given 1 dose of aspirin 324 mg Admitted to [facility]. Cardiology was consulted   Subjective:  Patient was seen and examined this morning. Sitting up in recliner.  Not in distress.  No recurrence of symptoms. Feels ready to go home.  Hospital Course:  Chest pain Elevated troponin Patient with progressive left-sided chest pain and shortness of breath for 2 days Troponin mildly elevated as below.  EKG without ischemic changes Less likely ACS but her super obesity poses  significant risk for coronary disease Cardiology was consulted from ED. Aspirin 324 milligram was given in the ED. Echocardiogram did not show any wall motion abnormality. No further ischemia workup needed at this time.   Hypertensive urgency Initial blood pressure was very high at 202/104.  Patient denies history of hypertension and does not monitor blood pressure at home on a regular basis. It is possible that she had elevated blood pressure for last 2 to 3 days giving her chest pain shortness of breath. Cardiology started the patient on carvedilol. Plan to discharge on carvedilol 6.25 mg twice daily.  Continue to monitor blood pressure at home.  Follow-up with cardiology as an outpatient.  Elevated D-dimer D-dimer was elevated to 0.93 raising suspicion of pulm embolism.  Also reported pedal edema CT angio chest was suboptimal and could not rule out possibly a pulm embolism.   VQ scan was planned but could not be obtained because of her weight ultrasound duplex of lower extremities did not show any evidence of DVT.   Morbid Obesity  Body mass index is 79.82 kg/m. Patient has been advised to make an attempt to improve diet and exercise patterns to aid in weight loss.   Suspect sleep apnea Reports waking up at night short of breath last week.  Outpatient sleep study advised.   Chronic microcytic anemia H/o abnormal uterine bleeding Reports she has not had a menstrual bleeding in the last several months.   Pregnancy test was negative.   Ferritin level low at 26.  Oral iron supplement started. Recent Labs    11/23/22 0341 03/18/23 1952 03/19/23 0928 03/19/23 1622 03/20/23 0328  HGB 10.4* 10.8*  --   --  9.9*  MCV 73.2* 76.6*  --   --  77.8*  VITAMINB12  --   --  303  --   --   FOLATE  --   --  8.1  --   --   FERRITIN  --   --  26  --   --   TIBC  --   --  350  --   --   IRON  --   --  37  --   --   RETICCTPCT  --   --   --  1.6  --     Mobility: Encourage  ambulation  Goals of care   Code Status: Full Code    Diet:  Diet Order             Diet - low sodium heart healthy           Diet regular Fluid consistency: Thin  Diet effective now                   Nutritional status:  Body mass index is 77.61 kg/m.        Wounds:  -    Discharge Exam:   Vitals:   03/20/23 0044 03/20/23 0426 03/20/23 0438 03/20/23 0759  BP: 131/71  (!) 143/70 (!) 141/82  Pulse: 81  80 81  Resp: 17  20 19   Temp: 97.7 F (36.5 C)  98.4 F (36.9 C) (!) 97.5 F (36.4 C)  TempSrc: Oral  Oral Oral  SpO2: 97%  95% 95%  Weight:  (!) 205.1 kg    Height:        Body mass index is 77.61 kg/m.  General exam: Pleasant, young morbidly obese female Skin: No rashes, lesions or ulcers. HEENT: Atraumatic, normocephalic, no obvious bleeding Lungs: Diminished air entry in both bases, otherwise clear to auscultation bilaterally CVS: Regular rate and rhythm, no murmur GI/Abd soft, nontender, distended from obesity, bowel sound present CNS: Alert, awake, oriented x 3 Psychiatry: Mood appropriate Extremities: Trace bilateral pedal edema likely due to obesity.  Follow ups:    Follow-up Information     [facility] [place], [place]. Follow up.   Why: The office will call patient. Contact information: [address] [place] [address] [phone]         [facility] Follow up.   Contact information: [address] [place]  [address] [phone]         Parke Poisson, MD Follow up.   Specialties: Cardiology, Radiology Contact information: [address] [place] [address] [phone]                 Discharge Instructions:   Discharge Instructions     Call MD for:  difficulty breathing, headache or visual disturbances   Complete by: As directed    Call MD for:  extreme fatigue   Complete by: As directed    Call MD for:  hives   Complete by: As directed     Call MD for:  persistant dizziness or light-headedness   Complete by: As directed    Call MD for:  persistant nausea and vomiting   Complete by: As directed    Call MD for:  severe uncontrolled pain   Complete by: As directed    Call MD for:  temperature >100.4   Complete by: As directed    Diet - low sodium heart healthy   Complete by: As  directed    Discharge instructions   Complete by: As directed    Recommendations at discharge:   You been started on carvedilol 6.25 mg twice daily for blood pressure control.  Monitor blood pressure at home.  Follow-up with cardiology as an outpatient  General discharge instructions: Follow with Primary MD [facility] in 7 days  Please request your PCP  to go over your hospital tests, procedures, radiology results at the follow up. Please get your medicines reviewed and adjusted.  Your PCP may decide to repeat certain labs or tests as needed. Do not drive, operate heavy machinery, perform activities at heights, swimming or participation in water activities or provide baby sitting services if your were admitted for syncope or siezures until you have seen by Primary MD or a Neurologist and advised to do so again. [place] Controlled Substance Reporting System database was reviewed. Do not drive, operate heavy machinery, perform activities at heights, swim, participate in water activities or provide baby-sitting services while on medications for pain, sleep and mood until your outpatient physician has reevaluated you and advised to do so again.  You are strongly recommended to comply with the dose, frequency and duration of prescribed medications. Activity: As tolerated with Full fall precautions use walker/cane & assistance as needed Avoid using any recreational substances like cigarette, tobacco, alcohol, or non-prescribed drug. If you experience worsening of your admission symptoms, develop shortness of breath, life  threatening emergency, suicidal or homicidal thoughts you must seek medical attention immediately by calling 911 or calling your MD immediately  if symptoms less severe. You must read complete instructions/literature along with all the possible adverse reactions/side effects for all the medicines you take and that have been prescribed to you. Take any new medicine only after you have completely understood and accepted all the possible adverse reactions/side effects.  Wear Seat belts while driving. You were cared for by a hospitalist during your hospital stay. If you have any questions about your discharge medications or the care you received while you were in the hospital after you are discharged, you can call the unit and ask to speak with the hospitalist or the covering physician. Once you are discharged, your primary care physician will handle any further medical issues. Please note that NO REFILLS for any discharge medications will be authorized once you are discharged, as it is imperative that you return to your primary care physician (or establish a relationship with a primary care physician if you do not have one).   Increase activity slowly   Complete by: As directed        Discharge Medications:   Allergies as of 03/20/2023   No Known Allergies      Medication List     TAKE these medications    carvedilol 6.25 MG tablet Commonly known as: COREG Take 1 tablet (6.25 mg total) by mouth 2 (two) times daily with a meal.   ferrous sulfate 325 (65 FE) MG tablet Take 1 tablet (325 mg total) by mouth daily with breakfast.   Ventolin HFA 108 (90 Base) MCG/ACT inhaler Generic drug: albuterol Inhale 2 puffs into the lungs every 4 (four) hours as needed for wheezing or shortness of breath.         The results of significant diagnostics from this hospitalization (including imaging, microbiology, ancillary and laboratory) are listed below for reference.    Procedures and Diagnostic  Studies:   VAS [place] LOWER EXTREMITY VENOUS (DVT)  Result Date:  03/19/2023  Lower Venous DVT Study Patient Name:  SHAREKIA PLYMIRE Kirk  Date of Exam:   03/19/2023 Medical Rec #: [id number]         Accession #:    [id number] Date of Birth: 1991/03/03         Patient Gender: F Patient Age:   53 years Exam Location:  [facility] Procedure:      VAS [place] LOWER EXTREMITY VENOUS (DVT) Referring Phys: Lorin Glass --------------------------------------------------------------------------------  Indications: SOB, and Chest pain.  Limitations: Morbidly obese and body habitus. Comparison Study: No priors. Performing Technologist: Marilynne Halsted RDMS, RVT  Examination Guidelines: A complete evaluation includes B-mode imaging, spectral Doppler, color Doppler, and power Doppler as needed of all accessible portions of each vessel. Bilateral testing is considered an integral part of a complete examination. Limited examinations for reoccurring indications may be performed as noted. The reflux portion of the exam is performed with the patient in reverse Trendelenburg.  +---------+---------------+---------+-----------+----------+--------------+ RIGHT    CompressibilityPhasicitySpontaneityPropertiesThrombus Aging +---------+---------------+---------+-----------+----------+--------------+ CFV      Full           Yes      Yes                                 +---------+---------------+---------+-----------+----------+--------------+ SFJ      Full                                                        +---------+---------------+---------+-----------+----------+--------------+ FV Prox  Full                                                        +---------+---------------+---------+-----------+----------+--------------+ FV Mid   Full                                                        +---------+---------------+---------+-----------+----------+--------------+ FV DistalFull                                                         +---------+---------------+---------+-----------+----------+--------------+ PFV      Full                                                        +---------+---------------+---------+-----------+----------+--------------+ POP      Full                                                        +---------+---------------+---------+-----------+----------+--------------+ PTV  appears patent +---------+---------------+---------+-----------+----------+--------------+ PERO                                                  appears patent +---------+---------------+---------+-----------+----------+--------------+   +---------+---------------+---------+-----------+----------+--------------+ LEFT     CompressibilityPhasicitySpontaneityPropertiesThrombus Aging +---------+---------------+---------+-----------+----------+--------------+ CFV      Full           Yes      Yes                                 +---------+---------------+---------+-----------+----------+--------------+ SFJ      Full                                                        +---------+---------------+---------+-----------+----------+--------------+ FV Prox  Full                                                        +---------+---------------+---------+-----------+----------+--------------+ FV Mid   Full                                                        +---------+---------------+---------+-----------+----------+--------------+ FV DistalFull                                                        +---------+---------------+---------+-----------+----------+--------------+ PFV      Full                                                        +---------+---------------+---------+-----------+----------+--------------+ POP      Full           Yes      Yes                                  +---------+---------------+---------+-----------+----------+--------------+ PTV                                                   appears patent +---------+---------------+---------+-----------+----------+--------------+ PERO                                                  appears patent +---------+---------------+---------+-----------+----------+--------------+     Summary: BILATERAL: -  No evidence of deep vein thrombosis seen in the lower extremities, bilaterally. -No evidence of popliteal cyst, bilaterally.   *See table(s) above for measurements and observations. Electronically signed by Heath Lark on 03/19/2023 at 6:14:12 PM.    Final    ECHOCARDIOGRAM COMPLETE  Result Date: 03/19/2023    ECHOCARDIOGRAM REPORT   Patient Name:   SAIRA DRUMMONDS Duva Date of Exam: 03/19/2023 Medical Rec #:  [id number]        Height:       64.0 in Accession #:    [id number]       Weight:       465.0 lb Date of Birth:  08/29/1990        BSA:          2.800 m Patient Age:    32 years         BP:           133/64 mmHg Patient Gender: F                HR:           81 bpm. Exam Location:  Inpatient Procedure: 2D Echo, Cardiac Doppler and Color Doppler Indications:    Cheswt Pain  History:        Patient has no prior history of Echocardiogram examinations.                 Signs/Symptoms:Chest Pain; Risk Factors:Former Smoker.  Sonographer:    Dondra Prader RVT RCS Referring Phys: [id number] [facility]  Sonographer Comments: Technically challenging study due to limited acoustic windows, suboptimal parasternal window, suboptimal apical window, suboptimal subcostal window and patient is obese. Image acquisition challenging due to patient body habitus. Decline Definity IMPRESSIONS  1. Left ventricular ejection fraction, by estimation, is 60 to 65%. The left ventricle has normal function. The left ventricle has no regional wall motion abnormalities. There is mild left ventricular hypertrophy of the basal-septal segment. Left  ventricular diastolic parameters were normal.  2. Right ventricular systolic function is normal. The right ventricular size is normal.  3. The mitral valve is normal in structure. Trivial mitral valve regurgitation. No evidence of mitral stenosis.  4. The aortic valve is normal in structure. There is mild calcification of the aortic valve. Aortic valve regurgitation is not visualized. No aortic stenosis is present.  5. The inferior vena cava is normal in size with greater than 50% respiratory variability, suggesting right atrial pressure of 3 mmHg. FINDINGS  Left Ventricle: Left ventricular ejection fraction, by estimation, is 60 to 65%. The left ventricle has normal function. The left ventricle has no regional wall motion abnormalities. The left ventricular internal cavity size was normal in size. There is  mild left ventricular hypertrophy of the basal-septal segment. Left ventricular diastolic parameters were normal. Right Ventricle: The right ventricular size is normal. No increase in right ventricular wall thickness. Right ventricular systolic function is normal. Left Atrium: Left atrial size was normal in size. Right Atrium: Right atrial size was normal in size. Pericardium: There is no evidence of pericardial effusion. Mitral Valve: The mitral valve is normal in structure. Trivial mitral valve regurgitation. No evidence of mitral valve stenosis. Tricuspid Valve: The tricuspid valve is normal in structure. Tricuspid valve regurgitation is not demonstrated. No evidence of tricuspid stenosis. Aortic Valve: The aortic valve is normal in structure. There is mild calcification of the aortic valve. There is moderate aortic valve annular calcification. Aortic valve regurgitation is not visualized. No aortic stenosis  is present. Aortic valve mean gradient measures 4.0 mmHg. Aortic valve peak gradient measures 7.8 mmHg. Aortic valve area, by VTI measures 3.03 cm. Pulmonic Valve: The pulmonic valve was normal in  structure. Pulmonic valve regurgitation is not visualized. No evidence of pulmonic stenosis. Aorta: The aortic root is normal in size and structure. Venous: The inferior vena cava is normal in size with greater than 50% respiratory variability, suggesting right atrial pressure of 3 mmHg. IAS/Shunts: No atrial level shunt detected by color flow Doppler.  LEFT VENTRICLE PLAX 2D LVIDd:         4.90 cm   Diastology LVIDs:         3.10 cm   LV e' medial:    9.03 cm/s LV PW:         1.10 cm   LV E/e' medial:  10.0 LV IVS:        1.40 cm   LV e' lateral:   13.50 cm/s LVOT diam:     2.20 cm   LV E/e' lateral: 6.7 LV SV:         93 LV SV Index:   33 LVOT Area:     3.80 cm  RIGHT VENTRICLE             IVC RV S prime:     14.70 cm/s  IVC diam: 2.00 cm TAPSE (M-mode): 2.2 cm LEFT ATRIUM             Index        RIGHT ATRIUM           Index LA diam:        3.50 cm 1.25 cm/m   RA Area:     13.20 cm LA Vol (A2C):   68.7 ml 24.53 ml/m  RA Volume:   29.20 ml  10.43 ml/m LA Vol (A4C):   57.9 ml 20.68 ml/m LA Biplane Vol: 66.0 ml 23.57 ml/m  AORTIC VALVE                    PULMONIC VALVE AV Area (Vmax):    2.96 cm     PV Vmax:       1.18 m/s AV Area (Vmean):   2.94 cm     PV Peak grad:  5.6 mmHg AV Area (VTI):     3.03 cm AV Vmax:           140.00 cm/s AV Vmean:          98.600 cm/s AV VTI:            0.307 m AV Peak Grad:      7.8 mmHg AV Mean Grad:      4.0 mmHg LVOT Vmax:         109.00 cm/s LVOT Vmean:        76.200 cm/s LVOT VTI:          0.245 m LVOT/AV VTI ratio: 0.80  AORTA Ao Root diam: 2.60 cm Ao Asc diam:  2.80 cm MITRAL VALVE MV Area (PHT): 3.76 cm    SHUNTS MV Decel Time: 202 msec    Systemic VTI:  0.24 m MV E velocity: 90.30 cm/s  Systemic Diam: 2.20 cm MV A velocity: 72.20 cm/s MV E/A ratio:  1.25 Aditya Sabharwal Electronically signed by Dorthula Nettles Signature Date/Time: 03/19/2023/3:32:13 PM    Final    CT Angio Chest PE W/Cm &/Or Wo Cm  Result Date: 03/19/2023 CLINICAL DATA:  Pulmonary embolism  suspected,  high probability. Left-sided chest pain beginning 2 days ago. EXAM: CT ANGIOGRAPHY CHEST WITH CONTRAST TECHNIQUE: Multidetector CT imaging of the chest was performed using the standard protocol during bolus administration of intravenous contrast. Multiplanar CT image reconstructions and MIPs were obtained to evaluate the vascular anatomy. RADIATION DOSE REDUCTION: This exam was performed according to the departmental dose-optimization program which includes automated exposure control, adjustment of the mA and/or kV according to patient size and/or use of iterative reconstruction technique. CONTRAST:  OMNIPAQUE IOHEXOL 350 MG/ML SOLN COMPARISON:  11/23/2022. FINDINGS: Cardiovascular: Preferential opacification of the thoracic aorta. The possibility of pulmonary embolism can not be excluded. No evidence of thoracic aortic aneurysm or dissection. Normal heart size. No pericardial effusion. Mediastinum/Nodes: No enlarged mediastinal, hilar, or axillary lymph nodes. Thyroid gland, trachea, and esophagus demonstrate no significant findings. Lungs/Pleura: Lungs are clear. No pleural effusion or pneumothorax. Upper Abdomen: Stones are present within the gallbladder. No acute abnormality. Musculoskeletal: No acute osseous abnormality. Review of the MIP images confirms the above findings. IMPRESSION: 1. Suboptimal opacification of the pulmonary arteries. The possibility of pulmonary embolism can not be excluded. The need for repeat evaluation or V/Q scan should be determined clinically. 2. No acute process in the chest. 3. Cholelithiasis. Electronically Signed   By: Thornell Sartorius M.D.   On: 03/19/2023 04:53   DG Chest 2 View  Result Date: 03/18/2023 CLINICAL DATA:  Chest pain EXAM: CHEST - 2 VIEW COMPARISON:  Radiograph and CT 11/23/2018 FINDINGS: Mild cardiomegaly is unchanged from prior exam. Mild peribronchial thickening without focal airspace disease. No pleural effusion or pneumothorax. No pulmonary  edema. No acute osseous findings. IMPRESSION: 1. Mild cardiomegaly, unchanged since July. 2. Mild peribronchial thickening without focal airspace disease. Electronically Signed   By: Narda Rutherford M.D.   On: 03/18/2023 22:55     Labs:   Basic Metabolic Panel: Recent Labs  Lab 03/18/23 1952 03/20/23 0328  NA 140 137  K 3.5 3.4*  CL 104 103  CO2 27 28  GLUCOSE 110* 105*  BUN 5* 5*  CREATININE 0.58 0.52  CALCIUM 8.9 8.4*   GFR Estimated Creatinine Clearance: 179.7 mL/min (by C-G formula based on SCr of 0.52 mg/dL). Liver Function Tests: No results for input(s): "AST", "ALT", "ALKPHOS", "BILITOT", "PROT", "ALBUMIN" in the last 168 hours. No results for input(s): "LIPASE", "AMYLASE" in the last 168 hours. No results for input(s): "AMMONIA" in the last 168 hours. Coagulation profile No results for input(s): "INR", "PROTIME" in the last 168 hours.  CBC: Recent Labs  Lab 03/18/23 1952 03/20/23 0328  WBC 9.0 7.2  HGB 10.8* 9.9*  HCT 34.7* 31.8*  MCV 76.6* 77.8*  PLT 380 323   Cardiac Enzymes: No results for input(s): "CKTOTAL", "CKMB", "CKMBINDEX", "TROPONINI" in the last 168 hours. BNP: Invalid input(s): "POCBNP" CBG: No results for input(s): "GLUCAP" in the last 168 hours. D-Dimer Recent Labs    03/19/23 0559  DDIMER 0.93*   Hgb A1c No results for input(s): "HGBA1C" in the last 72 hours. Lipid Profile No results for input(s): "CHOL", "HDL", "LDLCALC", "TRIG", "CHOLHDL", "LDLDIRECT" in the last 72 hours. Thyroid function studies No results for input(s): "TSH", "T4TOTAL", "T3FREE", "THYROIDAB" in the last 72 hours.  Invalid input(s): "FREET3" Anemia work up Recent Labs    03/19/23 0928 03/19/23 1622  VITAMINB12 303  --   FOLATE 8.1  --   FERRITIN 26  --   TIBC 350  --   IRON 37  --   RETICCTPCT  --  1.6   Microbiology No results found for this or any previous visit (from the past 240 hour(s)).  Time coordinating discharge: 45  minutes  Signed: Niam Nepomuceno  [facility] 03/20/2023, 12:57 PM

## 2023-03-20 NOTE — Progress Notes (Signed)
SATURATION QUALIFICATIONS: (This note is used to comply with regulatory documentation for home oxygen)  Patient Saturations on Room Air at Rest = 94%  Patient Saturations on Room Air while Ambulating = 93%  Patient Saturations on 0 Liters of oxygen while Ambulating = 93%  Please briefly explain why patient needs home oxygen:

## 2023-03-20 NOTE — Progress Notes (Signed)
Echo stable.  Uptitrate carvedilol. D/w primary service. Will arrange f/u.  Parke Poisson, MD

## 2023-03-20 NOTE — TOC Initial Note (Signed)
Transition of Care [facility]) - Initial/Assessment Note    Patient Details  Name: Caitlin Hill MRN: [id number] Date of Birth: [birth year]  Transition of Care [facility]) CM/SW Contact:    Leone Haven, RN Phone Number: 03/20/2023, 11:16 AM  Clinical Narrative:                 From home alone with child,has PCP and insurance on file, states has no HH services in place at this time or DME at home.  States she will drive herself home at dc and she has  no  support system, states gets medications from [facility] on [facility].  Pta self ambulatory. She will need a note for daughter from MD.   Expected Discharge Plan: Home/Self Care Barriers to Discharge: No Barriers Identified   Patient Goals and CMS Choice Patient states their goals for this hospitalization and ongoing recovery are:: return home   Choice offered to / list presented to : NA      Expected Discharge Plan and Services In-house Referral: NA Discharge Planning Services: CM Consult Post Acute Care Choice: NA Living arrangements for the past 2 months: Single Family Home                 DME Arranged: N/A DME Agency: NA       HH Arranged: NA          Prior Living Arrangements/Services Living arrangements for the past 2 months: Single Family Home Lives with:: Minor Children, Self Patient language and need for interpreter reviewed:: Yes Do you feel safe going back to the place where you live?: Yes      Need for Family Participation in Patient Care: No (Comment) Care giver support system in place?: No (comment)   Criminal Activity/Legal Involvement Pertinent to Current Situation/Hospitalization: No - Comment as needed  Activities of Daily Living   ADL Screening (condition at time of admission) Independently performs ADLs?: Yes (appropriate for developmental age) Is the patient deaf or have difficulty hearing?: No Does the patient have difficulty seeing, even when wearing glasses/contacts?: No Does the  patient have difficulty concentrating, remembering, or making decisions?: No  Permission Sought/Granted Permission sought to share information with : Case Manager Permission granted to share information with : Yes, Verbal Permission Granted              Emotional Assessment Appearance:: Appears stated age Attitude/Demeanor/Rapport: Engaged Affect (typically observed): Appropriate Orientation: : Oriented to Self, Oriented to Place, Oriented to  Time, Oriented to Situation Alcohol / Substance Use: Not Applicable Psych Involvement: No (comment)  Admission diagnosis:  Precordial chest pain [R07.2] Elevated troponin [R79.89] Chest pain [R07.9] Patient Active Problem List   Diagnosis Date Noted   Chest pain 03/19/2023   Morbid obesity (HCC) 10/22/2017   DUB (dysfunctional uterine bleeding) 10/22/2017   PCP:  [facility] Pharmacy:   [id number] [place], [place] - [phone] AT [facility] & [address] [place] [address] Phone: [phone] Fax: [phone]     Social Determinants of Health (SDOH) Social History: SDOH Screenings   Food Insecurity: No Food Insecurity (03/19/2023)  Housing: Low Risk  (03/19/2023)  Transportation Needs: No Transportation Needs (03/19/2023)  Utilities: Not At Risk (03/19/2023)  Depression (PHQ2-9): Low Risk  (07/28/2020)  Tobacco Use: Medium Risk (03/18/2023)   SDOH Interventions:     Readmission Risk Interventions     No data to  display

## 2023-03-20 NOTE — TOC Transition Note (Signed)
Transition of Care [facility]) - CM/SW Discharge Note   Patient Details  Name: Caitlin Hill MRN: [id number] Date of Birth: Apr 20, 1991  Transition of Care [facility]) CM/SW Contact:  Leone Haven, RN Phone Number: 03/20/2023, 11:18 AM   Clinical Narrative:    For possible dc today, she will need a doctors note for daughter from doctor.  She will drive herself home at dc.    Final next level of care: Home/Self Care Barriers to Discharge: No Barriers Identified   Patient Goals and CMS Choice   Choice offered to / list presented to : NA  Discharge Placement                         Discharge Plan and Services Additional resources added to the After Visit Summary for   In-house Referral: NA Discharge Planning Services: CM Consult Post Acute Care Choice: NA          DME Arranged: N/A DME Agency: NA       HH Arranged: NA          Social Determinants of Health (SDOH) Interventions SDOH Screenings   Food Insecurity: No Food Insecurity (03/19/2023)  Housing: Low Risk  (03/19/2023)  Transportation Needs: No Transportation Needs (03/19/2023)  Utilities: Not At Risk (03/19/2023)  Depression (PHQ2-9): Low Risk  (07/28/2020)  Tobacco Use: Medium Risk (03/18/2023)     Readmission Risk Interventions     No data to display

## 2023-03-31 NOTE — Progress Notes (Unsigned)
  Cardiology Office Note:  .   Date:  03/31/2023  ID:  Caitlin Hill, DOB 02-25-1991, MRN [id number] PCP: [facility]  [facility] Providers Cardiologist:  Dr.Acharya 1}   }   History of Present Illness: .   Caitlin Hill is a 32 y.o. female with past medical history of abnormal uterine bleeding without any past cardiac history.  She presented to ED on 03/18/2023 with left-sided chest pain that began 2 days prior with shortness of breath.  She was found to be hypertensive with a blood pressure of 202/104.  EKG revealed normal sinus rhythm without any ischemic changes.  She was found to be mildly anemic with a hemoglobin of 10.8 platelets 380, potassium 3.5, chest x-ray revealed mild cardiomegaly.  She was diagnosed with noncardiac chest pain triggered by emotional stress.  Echocardiogram revealed an EF of 60 to 65% with mild LVH.  She was started on carvedilol 6.25 mg twice daily for blood pressure control.  She is here for posthospitalization follow-up.  ROS: ***  Studies Reviewed: .   Echocardiogram 03/19/2023 1. Left ventricular ejection fraction, by estimation, is 60 to 65%. The  left ventricle has normal function. The left ventricle has no regional  wall motion abnormalities. There is mild left ventricular hypertrophy of  the basal-septal segment. Left  ventricular diastolic parameters were normal.   2. Right ventricular systolic function is normal. The right ventricular  size is normal.   3. The mitral valve is normal in structure. Trivial mitral valve  regurgitation. No evidence of mitral stenosis.   4. The aortic valve is normal in structure. There is mild calcification  of the aortic valve. Aortic valve regurgitation is not visualized. No  aortic stenosis is present.   5. The inferior vena cava is normal in size with greater than 50%  respiratory variability, suggesting right atrial pressure of 3   *** EKG Interpretation Date/Time:     Ventricular Rate:    PR Interval:    QRS Duration:    QT Interval:    QTC Calculation:   R Axis:      Text Interpretation:      Physical Exam:   VS:  LMP  (LMP Unknown)    Wt Readings from Last 3 Encounters:  03/20/23 (!) 452 lb 2.6 oz (205.1 kg)  11/23/22 (!) 464 lb (210.5 kg)  07/28/20 (!) 419 lb 3.2 oz (190.1 kg)    GEN: Well nourished, well developed in no acute distress NECK: No JVD; No carotid bruits CARDIAC: ***RRR, no murmurs, rubs, gallops RESPIRATORY:  Clear to auscultation without rales, wheezing or rhonchi  ABDOMEN: Soft, non-tender, non-distended EXTREMITIES:  No edema; No deformity   ASSESSMENT AND PLAN: .   ***    {Are you ordering a CV Procedure (e.g. stress test, cath, DCCV, TEE, etc)?   Press F2        :[id number]}    Signed, Bettey Mare. Liborio Nixon, ANP, AACC

## 2023-04-01 ENCOUNTER — Ambulatory Visit: Payer: Medicaid Other | Attending: Adult Health | Admitting: Adult Health

## 2023-05-05 ENCOUNTER — Ambulatory Visit (HOSPITAL_COMMUNITY): Payer: Medicaid Other
